# Patient Record
Sex: Female | Born: 1975 | Race: White | Hispanic: No | Marital: Married | Smoking: Never smoker
Health system: Southern US, Community
[De-identification: ages and names within clinical notes are randomized; demographics above are authoritative.]

## PROBLEM LIST (undated history)

## (undated) DIAGNOSIS — K219 Gastro-esophageal reflux disease without esophagitis: Secondary | ICD-10-CM

## (undated) DIAGNOSIS — F419 Anxiety disorder, unspecified: Secondary | ICD-10-CM

## (undated) DIAGNOSIS — M1990 Unspecified osteoarthritis, unspecified site: Secondary | ICD-10-CM

## (undated) DIAGNOSIS — F329 Major depressive disorder, single episode, unspecified: Secondary | ICD-10-CM

## (undated) DIAGNOSIS — F32A Depression, unspecified: Secondary | ICD-10-CM

## (undated) DIAGNOSIS — K52832 Lymphocytic colitis: Principal | ICD-10-CM

## (undated) DIAGNOSIS — Z803 Family history of malignant neoplasm of breast: Secondary | ICD-10-CM

## (undated) DIAGNOSIS — N63 Unspecified lump in unspecified breast: Secondary | ICD-10-CM

## (undated) DIAGNOSIS — R112 Nausea with vomiting, unspecified: Secondary | ICD-10-CM

## (undated) DIAGNOSIS — Z973 Presence of spectacles and contact lenses: Secondary | ICD-10-CM

## (undated) DIAGNOSIS — Z800 Family history of malignant neoplasm of digestive organs: Secondary | ICD-10-CM

## (undated) DIAGNOSIS — Z9889 Other specified postprocedural states: Secondary | ICD-10-CM

## (undated) DIAGNOSIS — R7303 Prediabetes: Secondary | ICD-10-CM

## (undated) DIAGNOSIS — E039 Hypothyroidism, unspecified: Secondary | ICD-10-CM

## (undated) HISTORY — PX: TONSILLECTOMY: SUR1361

## (undated) HISTORY — PX: COLONOSCOPY: SHX174

## (undated) HISTORY — DX: Family history of malignant neoplasm of breast: Z80.3

## (undated) HISTORY — DX: Family history of malignant neoplasm of digestive organs: Z80.0

## (undated) HISTORY — PX: BREAST BIOPSY: SHX20

## (undated) HISTORY — PX: OTHER SURGICAL HISTORY: SHX169

## (undated) HISTORY — DX: Lymphocytic colitis: K52.832

---

## 1998-09-29 ENCOUNTER — Other Ambulatory Visit: Admission: RE | Admit: 1998-09-29 | Discharge: 1998-09-29 | Payer: Self-pay | Admitting: *Deleted

## 1999-02-10 ENCOUNTER — Other Ambulatory Visit: Admission: RE | Admit: 1999-02-10 | Discharge: 1999-02-10 | Payer: Self-pay | Admitting: *Deleted

## 1999-03-11 ENCOUNTER — Other Ambulatory Visit: Admission: RE | Admit: 1999-03-11 | Discharge: 1999-03-11 | Payer: Self-pay | Admitting: *Deleted

## 1999-09-22 ENCOUNTER — Other Ambulatory Visit: Admission: RE | Admit: 1999-09-22 | Discharge: 1999-09-22 | Payer: Self-pay | Admitting: *Deleted

## 1999-11-24 ENCOUNTER — Other Ambulatory Visit: Admission: RE | Admit: 1999-11-24 | Discharge: 1999-11-24 | Payer: Self-pay | Admitting: *Deleted

## 1999-11-24 ENCOUNTER — Encounter (INDEPENDENT_AMBULATORY_CARE_PROVIDER_SITE_OTHER): Payer: Self-pay | Admitting: Specialist

## 2000-06-09 ENCOUNTER — Other Ambulatory Visit: Admission: RE | Admit: 2000-06-09 | Discharge: 2000-06-09 | Payer: Self-pay | Admitting: *Deleted

## 2000-06-20 ENCOUNTER — Emergency Department (HOSPITAL_COMMUNITY): Admission: EM | Admit: 2000-06-20 | Discharge: 2000-06-20 | Payer: Self-pay | Admitting: Emergency Medicine

## 2000-11-16 ENCOUNTER — Other Ambulatory Visit: Admission: RE | Admit: 2000-11-16 | Discharge: 2000-11-16 | Payer: Self-pay | Admitting: *Deleted

## 2000-11-18 ENCOUNTER — Encounter: Admission: RE | Admit: 2000-11-18 | Discharge: 2000-11-18 | Payer: Self-pay | Admitting: *Deleted

## 2000-11-18 ENCOUNTER — Encounter: Payer: Self-pay | Admitting: *Deleted

## 2001-06-15 ENCOUNTER — Other Ambulatory Visit: Admission: RE | Admit: 2001-06-15 | Discharge: 2001-06-15 | Payer: Self-pay | Admitting: *Deleted

## 2001-07-07 ENCOUNTER — Emergency Department (HOSPITAL_COMMUNITY): Admission: EM | Admit: 2001-07-07 | Discharge: 2001-07-08 | Payer: Self-pay | Admitting: Emergency Medicine

## 2001-12-13 ENCOUNTER — Other Ambulatory Visit: Admission: RE | Admit: 2001-12-13 | Discharge: 2001-12-13 | Payer: Self-pay | Admitting: *Deleted

## 2002-12-20 ENCOUNTER — Other Ambulatory Visit: Admission: RE | Admit: 2002-12-20 | Discharge: 2002-12-20 | Payer: Self-pay | Admitting: *Deleted

## 2004-01-30 ENCOUNTER — Other Ambulatory Visit: Admission: RE | Admit: 2004-01-30 | Discharge: 2004-01-30 | Payer: Self-pay | Admitting: *Deleted

## 2004-02-03 ENCOUNTER — Encounter: Admission: RE | Admit: 2004-02-03 | Discharge: 2004-02-03 | Payer: Self-pay | Admitting: *Deleted

## 2005-04-26 ENCOUNTER — Other Ambulatory Visit: Admission: RE | Admit: 2005-04-26 | Discharge: 2005-04-26 | Payer: Self-pay | Admitting: Obstetrics and Gynecology

## 2007-02-24 ENCOUNTER — Encounter: Admission: RE | Admit: 2007-02-24 | Discharge: 2007-02-24 | Payer: Self-pay | Admitting: Obstetrics and Gynecology

## 2008-11-22 ENCOUNTER — Encounter: Admission: RE | Admit: 2008-11-22 | Discharge: 2008-11-22 | Payer: Self-pay | Admitting: Obstetrics and Gynecology

## 2009-11-28 ENCOUNTER — Emergency Department (HOSPITAL_BASED_OUTPATIENT_CLINIC_OR_DEPARTMENT_OTHER): Admission: EM | Admit: 2009-11-28 | Discharge: 2009-11-29 | Payer: Self-pay | Admitting: Emergency Medicine

## 2009-11-29 ENCOUNTER — Ambulatory Visit: Payer: Self-pay | Admitting: Diagnostic Radiology

## 2010-11-01 ENCOUNTER — Encounter: Payer: Self-pay | Admitting: Obstetrics and Gynecology

## 2011-02-04 ENCOUNTER — Other Ambulatory Visit: Payer: Self-pay | Admitting: Obstetrics and Gynecology

## 2011-02-04 DIAGNOSIS — N6324 Unspecified lump in the left breast, lower inner quadrant: Secondary | ICD-10-CM

## 2011-02-09 ENCOUNTER — Ambulatory Visit
Admission: RE | Admit: 2011-02-09 | Discharge: 2011-02-09 | Disposition: A | Discharge status: Home / Self Care | Payer: Managed Care, Other (non HMO) | Source: Ambulatory Visit | Attending: Obstetrics and Gynecology | Admitting: Obstetrics and Gynecology

## 2011-02-09 ENCOUNTER — Other Ambulatory Visit: Payer: Self-pay | Admitting: Obstetrics and Gynecology

## 2011-02-09 DIAGNOSIS — N6324 Unspecified lump in the left breast, lower inner quadrant: Secondary | ICD-10-CM

## 2011-02-19 ENCOUNTER — Ambulatory Visit
Admission: RE | Admit: 2011-02-19 | Discharge: 2011-02-19 | Disposition: A | Discharge status: Home / Self Care | Payer: Managed Care, Other (non HMO) | Source: Ambulatory Visit | Attending: Obstetrics and Gynecology | Admitting: Obstetrics and Gynecology

## 2011-02-19 ENCOUNTER — Other Ambulatory Visit: Payer: Self-pay | Admitting: Diagnostic Radiology

## 2011-02-19 ENCOUNTER — Other Ambulatory Visit: Payer: Self-pay | Admitting: Obstetrics and Gynecology

## 2011-02-19 DIAGNOSIS — N6324 Unspecified lump in the left breast, lower inner quadrant: Secondary | ICD-10-CM

## 2011-02-19 HISTORY — PX: BREAST BIOPSY: SHX20

## 2011-06-01 ENCOUNTER — Encounter: Payer: Self-pay | Admitting: Student

## 2011-06-01 ENCOUNTER — Emergency Department (HOSPITAL_BASED_OUTPATIENT_CLINIC_OR_DEPARTMENT_OTHER)
Admission: EM | Admit: 2011-06-01 | Discharge: 2011-06-01 | Disposition: A | Discharge status: Home / Self Care | Payer: Managed Care, Other (non HMO) | Attending: Emergency Medicine | Admitting: Emergency Medicine

## 2011-06-01 DIAGNOSIS — R51 Headache: Secondary | ICD-10-CM | POA: Insufficient documentation

## 2011-06-01 HISTORY — DX: Depression, unspecified: F32.A

## 2011-06-01 HISTORY — DX: Major depressive disorder, single episode, unspecified: F32.9

## 2011-06-01 MED ORDER — KETOROLAC TROMETHAMINE 60 MG/2ML IM SOLN
60.0000 mg | Freq: Once | INTRAMUSCULAR | Status: AC
  Administered 2011-06-01: 60 mg via INTRAMUSCULAR
  Filled 2011-06-01: qty 2

## 2011-06-01 MED ORDER — PROMETHAZINE HCL 25 MG/ML IJ SOLN
25.0000 mg | Freq: Once | INTRAMUSCULAR | Status: AC
  Administered 2011-06-01: 25 mg via INTRAMUSCULAR
  Filled 2011-06-01: qty 1

## 2011-06-01 MED ORDER — PROMETHAZINE HCL 25 MG PO TABS: 25.0000 mg | ORAL_TABLET | Freq: Four times a day (QID) | ORAL | Status: AC | PRN

## 2011-06-01 NOTE — ED Provider Notes (Signed)
History     CSN: [id number] Arrival date & time: 06/01/2011  8:34 PM  Chief Complaint  Patient presents with  . Migraine   Patient is a 35 y.o. female presenting with headaches. The history is provided by the patient.  Headache  This is a new problem. The current episode started 3 to 5 hours ago. The problem occurs constantly. The problem has not changed since onset.The pain is located in the left unilateral region. The quality of the pain is described as sharp. The pain is at a severity of 10/10. The pain is severe. The pain does not radiate. Pertinent negatives include no anorexia, no fever, no malaise/fatigue, no near-syncope, no orthopnea, no shortness of breath and no nausea. She has tried nothing for the symptoms. The treatment provided no relief.  Pt has had previous migranes.  Pt reports she responds well to Torodol and phenergan.  Past Medical History  Diagnosis Date  . Migraine   . Depressed     Past Surgical History  Procedure Date  . Tonsillectomy     History reviewed. No pertinent family history.  History  Substance Use Topics  . Smoking status: Never Smoker   . Smokeless tobacco: Never Used  . Alcohol Use: No    OB History    Grav Para Term Preterm Abortions TAB SAB Ect Mult Living                  Review of Systems  Constitutional: Negative for fever and malaise/fatigue.  Respiratory: Negative for shortness of breath.   Cardiovascular: Negative for orthopnea and near-syncope.  Gastrointestinal: Negative for nausea and anorexia.  Neurological: Positive for headaches. Negative for dizziness, facial asymmetry, weakness, light-headedness and numbness.  All other systems reviewed and are negative.    Physical Exam  BP 124/86  Pulse 98  Temp(Src) 99.3 F (37.4 C) (Oral)  Resp 20  SpO2 100%  LMP 06/01/2011  Physical Exam  Nursing note and vitals reviewed. Constitutional: She is oriented to person, place, and time. She appears well-developed and  well-nourished.  HENT:  Head: Normocephalic and atraumatic.  Right Ear: External ear normal.  Left Ear: External ear normal.  Nose: Nose normal.  Mouth/Throat: Oropharynx is clear and moist.  Eyes: Conjunctivae and EOM are normal. Pupils are equal, round, and reactive to light.  Neck: Normal range of motion. Neck supple.  Cardiovascular: Normal rate and regular rhythm.   Pulmonary/Chest: Effort normal and breath sounds normal.  Abdominal: Soft.  Musculoskeletal: Normal range of motion.  Neurological: She is alert and oriented to person, place, and time. She has normal reflexes.  Skin: Skin is warm.  Psychiatric: She has a normal mood and affect.    ED Course  Procedures  MDM Pt given Torodol and Phenergan  Medical screening examination/treatment/procedure(s) were performed by non-physician practitioner and as supervising physician I was immediately available for consultation/collaboration. Osvaldo Human, M.D.    Langston Masker, [place] 06/01/11 2225  Carleene Cooper III, MD 06/02/11 2232

## 2011-06-01 NOTE — ED Notes (Signed)
Langston Masker PA-C at bedside to assess pt

## 2011-06-01 NOTE — ED Notes (Signed)
Pt in with c/o left side temple migraine HA x 2-3 days. Reports prior hx of MHA.

## 2011-07-05 ENCOUNTER — Encounter: Payer: Self-pay | Admitting: Internal Medicine

## 2011-08-12 ENCOUNTER — Other Ambulatory Visit: Payer: Managed Care, Other (non HMO) | Admitting: Internal Medicine

## 2011-08-16 ENCOUNTER — Other Ambulatory Visit: Payer: Self-pay | Admitting: Obstetrics and Gynecology

## 2011-08-16 DIAGNOSIS — N6009 Solitary cyst of unspecified breast: Secondary | ICD-10-CM

## 2011-08-18 ENCOUNTER — Other Ambulatory Visit: Payer: Managed Care, Other (non HMO) | Admitting: Internal Medicine

## 2011-08-20 ENCOUNTER — Other Ambulatory Visit: Payer: Managed Care, Other (non HMO)

## 2011-08-31 ENCOUNTER — Other Ambulatory Visit: Payer: Managed Care, Other (non HMO)

## 2011-11-17 ENCOUNTER — Other Ambulatory Visit: Payer: Self-pay | Admitting: Obstetrics and Gynecology

## 2011-11-17 ENCOUNTER — Ambulatory Visit
Admission: RE | Admit: 2011-11-17 | Discharge: 2011-11-17 | Disposition: A | Discharge status: Home / Self Care | Payer: Managed Care, Other (non HMO) | Source: Ambulatory Visit | Attending: Obstetrics and Gynecology | Admitting: Obstetrics and Gynecology

## 2011-11-17 DIAGNOSIS — N6009 Solitary cyst of unspecified breast: Secondary | ICD-10-CM

## 2012-11-06 ENCOUNTER — Other Ambulatory Visit: Payer: Self-pay | Admitting: *Deleted

## 2013-10-11 DIAGNOSIS — N809 Endometriosis, unspecified: Secondary | ICD-10-CM

## 2013-10-11 HISTORY — DX: Endometriosis, unspecified: N80.9

## 2013-10-16 ENCOUNTER — Ambulatory Visit (INDEPENDENT_AMBULATORY_CARE_PROVIDER_SITE_OTHER): Payer: BC Managed Care – PPO | Admitting: Internal Medicine

## 2013-10-16 ENCOUNTER — Encounter: Payer: Self-pay | Admitting: Internal Medicine

## 2013-10-16 VITALS — BP 110/78 | HR 78 | Ht 65.0 in | Wt 134.2 lb

## 2013-10-16 DIAGNOSIS — R197 Diarrhea, unspecified: Secondary | ICD-10-CM | POA: Insufficient documentation

## 2013-10-16 MED ORDER — NA SULFATE-K SULFATE-MG SULF 17.5-3.13-1.6 GM/177ML PO SOLN: ORAL | Status: DC

## 2013-10-16 NOTE — Patient Instructions (Signed)
You have been scheduled for a colonoscopy with propofol. Please follow written instructions given to you at your visit today.  Please use the prep kit you have been given today. If you use inhalers (even only as needed), please bring them with you on the day of your procedure.  I appreciate the opportunity to care for you.

## 2013-10-16 NOTE — Progress Notes (Signed)
Subjective:  Referred by: Gwenlyn Perking M.D.   Patient ID: Rachel Ryan, female    DOB: 11-Oct-1976, 38 y.o.   MRN: [id number]  HPI Patient is a very nice 38 year old married white woman who has had a 7 week history of diarrhea. Loose watery urgent stools during the day. No bleeding. There is bloating and occasional nausea but no abdominal pain. She's developed some anal discomfort. She's lost 2 or 3 pounds. Laboratory workup at primary care has shown normal TSH CBC CVAT overall except for slightly high sodium. Stool studies were ordered but she decided not to do those. She has not had recent antibiotics, there's been no foreign travel or sick contacts. She does not seem to have problems overnight. 14 years ago she had a colonoscopy because her mother apparently had a malignant polyp removed with her mother was young. She's had diarrhea off and on at times in her life but does not carry a diagnosis of irritable bowel syndrome or anything like that that I am aware of. She has never been tested for celiac disease. She has tried Imodium A-D and that has helped her but also has caused some bloating.  Allergies  Allergen Reactions  . Levofloxacin     Severe headache  . Sulfa Antibiotics     dizziness   Outpatient Prescriptions Prior to Visit  Medication Sig Dispense Refill  . ALPRAZolam (XANAX) 0.5 MG tablet Take 0.5 mg by mouth daily as needed. anxiety       . Aspirin-Acetaminophen-Caffeine (EXCEDRIN PO) Take 2 tablets by mouth every 8 (eight) hours as needed. headache       . diphenhydrAMINE (BENADRYL) 25 mg capsule Take 25 mg by mouth at bedtime as needed. sleep       . Multiple Vitamin (MULTIVITAMIN) tablet Take 1 tablet by mouth daily.        . sertraline (ZOLOFT) 50 MG tablet Take 50 mg by mouth daily.         No facility-administered medications prior to visit.   Past Medical History  Diagnosis Date  . Migraine   . Depressed    Past Surgical History  Procedure Laterality  Date  . Tonsillectomy    . Overbite    . Surgery to correct an overbite    . Colonoscopy     History   Social History  . Marital Status: Married    Spouse Name: N/A    Number of Children: N/A  . Years of Education: N/A   Occupational History  . unemployed    Social History Main Topics  . Smoking status: Never Smoker   . Smokeless tobacco: Never Used  . Alcohol Use: No  . Drug Use: No  . Sexual Activity:    Other Topics Concern  . None   Social History Narrative  . None   Family History  Problem Relation Age of Onset  . Colon polyps Mother     cancerous  . Breast cancer      Review of Systems All other review of systems negative denies any changes in stress in her life or anything like that.    Objective:   Physical Exam General:  Well-developed, well-nourished and in no acute distress Eyes:  anicteric. ENT:   Mouth and posterior pharynx free of lesions.  Neck:   supple w/o thyromegaly or mass.  Lungs: Clear to auscultation bilaterally. Heart:  S1S2, no rubs, murmurs, gallops. Abdomen:  soft, non-tender, no hepatosplenomegaly, hernia,  or mass and BS+.  Rectal: deferred Lymph:  no cervical or supraclavicular adenopathy. Extremities:   no edema Skin   no rash. Neuro:  A&O x 3.  Psych:  appropriate mood and  Affect.   Data Reviewed: Primary Notes from December, labs through care everywhere      Assessment & Plan:   1. Diarrhea    I appreciate the opportunity to care for this patient. CC: Gara Kroner, MD and Gwenlyn Perking, MD ([facility])

## 2013-10-16 NOTE — Assessment & Plan Note (Signed)
Cause unclear essentially she has a chronic diarrheal illness. It could be infection, could be the onset of inflammatory bowel disease. It is reasonable to pursue a colonoscopy. Risks benefits and indications were explained she understands and agrees to proceed. I'm not sure how to factor in the apparent history of malignant polyp in her normal there and her mother was 35 and is not having any problems with cancer since then. Await colonoscopy and then decide.  Depending upon the colonoscopy results she may need to be screened for celiac disease with antibodies.

## 2013-10-17 ENCOUNTER — Ambulatory Visit (AMBULATORY_SURGERY_CENTER): Payer: BC Managed Care – PPO | Admitting: Internal Medicine

## 2013-10-17 ENCOUNTER — Encounter: Payer: Self-pay | Admitting: Internal Medicine

## 2013-10-17 VITALS — BP 135/67 | HR 74 | Temp 99.5°F | Resp 20 | Ht 65.0 in | Wt 134.0 lb

## 2013-10-17 DIAGNOSIS — K5289 Other specified noninfective gastroenteritis and colitis: Secondary | ICD-10-CM

## 2013-10-17 DIAGNOSIS — R197 Diarrhea, unspecified: Secondary | ICD-10-CM

## 2013-10-17 HISTORY — PX: COLONOSCOPY: SHX174

## 2013-10-17 MED ORDER — DICYCLOMINE HCL 20 MG PO TABS: 20.0000 mg | ORAL_TABLET | Freq: Three times a day (TID) | ORAL | Status: DC

## 2013-10-17 MED ORDER — SODIUM CHLORIDE 0.9 % IV SOLN: 500.0000 mL | INTRAVENOUS | Status: DC

## 2013-10-17 NOTE — Op Note (Addendum)
[facility]  [address] [place], [id number]   COLONOSCOPY PROCEDURE REPORT  PATIENT: Rachel Ryan, Rachel Ryan  MR#: [id number] BIRTHDATE: [birth year] , 37  yrs. old GENDER: Female ENDOSCOPIST: Gatha Mayer, MD, [facility] PROCEDURE DATE:  10/17/2013 PROCEDURE:   Colonoscopy with biopsy First Screening Colonoscopy - Avg.  risk and is 50 yrs.  old or older - No.  Prior Negative Screening - Now for repeat screening. N/A  History of Adenoma - Now for follow-up colonoscopy & has been > or = to 3 yrs.  N/A  Polyps Removed Today? No.  Recommend repeat exam, <10 yrs? No. ASA CLASS:   Class II INDICATIONS:unexplained diarrhea. MEDICATIONS: propofol (Diprivan) 300mg  IV, MAC sedation, administered by CRNA, and These medications were titrated to patient response per physician's verbal order  DESCRIPTION OF PROCEDURE:   After the risks benefits and alternatives of the procedure were thoroughly explained, informed consent was obtained.  A digital rectal exam revealed no abnormalities of the rectum.   The LB [id number]  endoscope was introduced through the anus and advanced to the terminal ileum which was intubated for a short distance. No adverse events experienced.   The quality of the prep was excellent using Suprep The instrument was then slowly withdrawn as the colon was fully examined.  COLON FINDINGS: The mucosa appeared normal in the terminal ileum. A normal appearing cecum, ileocecal valve, and appendiceal orifice were identified.  The ascending, hepatic flexure, transverse, splenic flexure, descending, sigmoid colon and rectum appeared unremarkable.  No polyps or cancers were seen.  Multiple biopsies were performed.  Retroflexed views revealed no abnormalities. The time to cecum=2 minutes 02 seconds.  Withdrawal time=7 minutes 08 seconds.  The scope was withdrawn and the procedure completed. COMPLICATIONS: There were no complications.  ENDOSCOPIC IMPRESSION: 1.    Normal mucosa in the terminal ileum 2.   Normal colon; multiple biopsies were performed to look for microscopic colitis  RECOMMENDATIONS: 1.  Office will call with the results. 2.   If colon biopsies negative will do celiac testing. Trial of dicyclomine for now - do not take metronidazole yet.   eSigned:  Gatha Mayer, MD, [facility] 10/17/2013 11:59 AM Revised: 10/17/2013 11:59 AM  cc: Antony Contras, MD, Gwenlyn Perking, MD, and The Patient

## 2013-10-17 NOTE — Progress Notes (Signed)
Called to room to assist during endoscopic procedure.  Patient ID and intended procedure confirmed with present staff. Received instructions for my participation in the procedure from the performing physician.  

## 2013-10-17 NOTE — Progress Notes (Signed)
A/ox3 pleased with MAC, report to Karol RN 

## 2013-10-17 NOTE — Patient Instructions (Addendum)
The colonoscopy is normal but I took biopsies to look for microscopic colitis.  I will let you know the results in a few days but by early next week at the latest.  Please try the dicyclomine - I sent the prescription to [facility].  I appreciate the opportunity to care for you. Gatha Mayer, MD, FACG  YOU HAD AN ENDOSCOPIC PROCEDURE TODAY AT [facility]: Refer to the procedure report that was given to you for any specific questions about what was found during the examination.  If the procedure report does not answer your questions, please call your gastroenterologist to clarify.  If you requested that your care partner not be given the details of your procedure findings, then the procedure report has been included in a sealed envelope for you to review at your convenience later.  YOU SHOULD EXPECT: Some feelings of bloating in the abdomen. Passage of more gas than usual.  Walking can help get rid of the air that was put into your GI tract during the procedure and reduce the bloating. If you had a lower endoscopy (such as a colonoscopy or flexible sigmoidoscopy) you may notice spotting of blood in your stool or on the toilet paper. If you underwent a bowel prep for your procedure, then you may not have a normal bowel movement for a few days.  DIET: Your first meal following the procedure should be a light meal and then it is ok to progress to your normal diet.  A half-sandwich or bowl of soup is an example of a good first meal.  Heavy or fried foods are harder to digest and may make you feel nauseous or bloated.  Likewise meals heavy in dairy and vegetables can cause extra gas to form and this can also increase the bloating.  Drink plenty of fluids but you should avoid alcoholic beverages for 24 hours.  ACTIVITY: Your care partner should take you home directly after the procedure.  You should plan to take it easy, moving slowly for the rest of the day.  You can resume normal  activity the day after the procedure however you should NOT DRIVE or use heavy machinery for 24 hours (because of the sedation medicines used during the test).    SYMPTOMS TO REPORT IMMEDIATELY: A gastroenterologist can be reached at any hour.  During normal business hours, 8:30 AM to 5:00 PM Monday through Friday, call [phone].  After hours and on weekends, please call the GI answering service at [phone] who will take a message and have the physician on call contact you.   Following lower endoscopy (colonoscopy or flexible sigmoidoscopy):  Excessive amounts of blood in the stool  Significant tenderness or worsening of abdominal pains  Swelling of the abdomen that is new, acute  Fever of 100F or higher  FOLLOW UP: If any biopsies were taken you will be contacted by phone or by letter within the next 1-3 weeks.  Call your gastroenterologist if you have not heard about the biopsies in 3 weeks.  Our staff will call the home number listed on your records the next business day following your procedure to check on you and address any questions or concerns that you may have at that time regarding the information given to you following your procedure. This is a courtesy call and so if there is no answer at the home number and we have not heard from you through the emergency physician on call, we will assume that  you have returned to your regular daily activities without incident.  SIGNATURES/CONFIDENTIALITY: You and/or your care partner have signed paperwork which will be entered into your electronic medical record.  These signatures attest to the fact that that the information above on your After Visit Summary has been reviewed and is understood.  Full responsibility of the confidentiality of this discharge information lies with you and/or your care-partner.

## 2013-10-18 ENCOUNTER — Telehealth: Payer: Self-pay | Admitting: *Deleted

## 2013-10-18 NOTE — Telephone Encounter (Signed)
  Follow up Call-  Call back number 10/17/2013  Post procedure Call Back phone  # [phone]  Permission to leave phone message Yes     [facility]

## 2013-10-22 ENCOUNTER — Encounter: Payer: Self-pay | Admitting: Internal Medicine

## 2013-10-22 ENCOUNTER — Other Ambulatory Visit: Payer: Self-pay | Admitting: Internal Medicine

## 2013-10-22 DIAGNOSIS — K52832 Lymphocytic colitis: Secondary | ICD-10-CM

## 2013-10-22 HISTORY — DX: Lymphocytic colitis: K52.832

## 2013-10-22 MED ORDER — BUDESONIDE 3 MG PO CP24: 9.0000 mg | ORAL_CAPSULE | Freq: Every day | ORAL | Status: DC

## 2013-10-22 NOTE — Progress Notes (Signed)
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

## 2013-10-31 ENCOUNTER — Telehealth: Payer: Self-pay | Admitting: Internal Medicine

## 2013-11-01 NOTE — Telephone Encounter (Signed)
Sounds like she may be cured  1) No further Tx 2) keep f/u  3) did she get thrush Txed?

## 2013-11-01 NOTE — Telephone Encounter (Signed)
Spoke with patient today.  She said the Entocort will be $260 with her insurance.  She took 6 days of Flagyl she had on hand from the dentist, then developed thrush so stopped taking it.The diarrhea stopped after taking the Flagyl. So she wants you to advise her about less expensive rx  Sir, thank you.

## 2013-11-01 NOTE — Telephone Encounter (Signed)
Informed patient no further Tx.  She is going to call back and set up f/u appointment.  Her thrush is now gone after using apple cider vinegar which she swished and gargled with.  She found this idea on the Internet.

## 2014-03-02 ENCOUNTER — Encounter (HOSPITAL_BASED_OUTPATIENT_CLINIC_OR_DEPARTMENT_OTHER): Payer: Self-pay | Admitting: Emergency Medicine

## 2014-03-02 ENCOUNTER — Emergency Department (HOSPITAL_BASED_OUTPATIENT_CLINIC_OR_DEPARTMENT_OTHER)
Admission: EM | Admit: 2014-03-02 | Discharge: 2014-03-02 | Disposition: A | Discharge status: Home / Self Care | Payer: BC Managed Care – PPO | Attending: Emergency Medicine | Admitting: Emergency Medicine

## 2014-03-02 ENCOUNTER — Emergency Department (HOSPITAL_BASED_OUTPATIENT_CLINIC_OR_DEPARTMENT_OTHER): Payer: BC Managed Care – PPO

## 2014-03-02 DIAGNOSIS — R197 Diarrhea, unspecified: Secondary | ICD-10-CM | POA: Insufficient documentation

## 2014-03-02 DIAGNOSIS — F329 Major depressive disorder, single episode, unspecified: Secondary | ICD-10-CM | POA: Insufficient documentation

## 2014-03-02 DIAGNOSIS — R1110 Vomiting, unspecified: Secondary | ICD-10-CM | POA: Insufficient documentation

## 2014-03-02 DIAGNOSIS — F3289 Other specified depressive episodes: Secondary | ICD-10-CM | POA: Insufficient documentation

## 2014-03-02 DIAGNOSIS — N6009 Solitary cyst of unspecified breast: Secondary | ICD-10-CM | POA: Insufficient documentation

## 2014-03-02 DIAGNOSIS — Z8679 Personal history of other diseases of the circulatory system: Secondary | ICD-10-CM | POA: Insufficient documentation

## 2014-03-02 DIAGNOSIS — N83209 Unspecified ovarian cyst, unspecified side: Secondary | ICD-10-CM | POA: Insufficient documentation

## 2014-03-02 DIAGNOSIS — Z79899 Other long term (current) drug therapy: Secondary | ICD-10-CM | POA: Insufficient documentation

## 2014-03-02 DIAGNOSIS — Z8719 Personal history of other diseases of the digestive system: Secondary | ICD-10-CM | POA: Insufficient documentation

## 2014-03-02 DIAGNOSIS — Z3202 Encounter for pregnancy test, result negative: Secondary | ICD-10-CM | POA: Insufficient documentation

## 2014-03-02 LAB — CBC WITH DIFFERENTIAL/PLATELET
BASOS ABS: 0 10*3/uL (ref 0.0–0.1)
BASOS PCT: 0 % (ref 0–1)
EOS PCT: 0 % (ref 0–5)
Eosinophils Absolute: 0 10*3/uL (ref 0.0–0.7)
HCT: 35.5 % — ABNORMAL LOW (ref 36.0–46.0)
Hemoglobin: 12.1 g/dL (ref 12.0–15.0)
Lymphocytes Relative: 9 % — ABNORMAL LOW (ref 12–46)
Lymphs Abs: 1 10*3/uL (ref 0.7–4.0)
MCH: 33.8 pg (ref 26.0–34.0)
MCHC: 34.1 g/dL (ref 30.0–36.0)
MCV: 99.2 fL (ref 78.0–100.0)
Monocytes Absolute: 0.6 10*3/uL (ref 0.1–1.0)
Monocytes Relative: 6 % (ref 3–12)
Neutro Abs: 9 10*3/uL — ABNORMAL HIGH (ref 1.7–7.7)
Neutrophils Relative %: 85 % — ABNORMAL HIGH (ref 43–77)
PLATELETS: 257 10*3/uL (ref 150–400)
RBC: 3.58 MIL/uL — ABNORMAL LOW (ref 3.87–5.11)
RDW: 12.2 % (ref 11.5–15.5)
WBC: 10.7 10*3/uL — ABNORMAL HIGH (ref 4.0–10.5)

## 2014-03-02 LAB — URINALYSIS, ROUTINE W REFLEX MICROSCOPIC
Glucose, UA: NEGATIVE mg/dL
Ketones, ur: 15 mg/dL — AB
LEUKOCYTES UA: NEGATIVE
Nitrite: NEGATIVE
PROTEIN: 30 mg/dL — AB
Specific Gravity, Urine: 1.025 (ref 1.005–1.030)
UROBILINOGEN UA: 0.2 mg/dL (ref 0.0–1.0)
pH: 6.5 (ref 5.0–8.0)

## 2014-03-02 LAB — BASIC METABOLIC PANEL
BUN: 14 mg/dL (ref 6–23)
CALCIUM: 9.4 mg/dL (ref 8.4–10.5)
CO2: 22 meq/L (ref 19–32)
Chloride: 103 mEq/L (ref 96–112)
Creatinine, Ser: 0.7 mg/dL (ref 0.50–1.10)
GFR calc non Af Amer: >90 mL/min (ref >90)
Glucose, Bld: 110 mg/dL — ABNORMAL HIGH (ref 70–99)
Potassium: 3.8 mEq/L (ref 3.7–5.3)
Sodium: 141 mEq/L (ref 137–147)

## 2014-03-02 LAB — URINE MICROSCOPIC-ADD ON

## 2014-03-02 LAB — LIPASE, BLOOD: LIPASE: 33 U/L (ref 11–59)

## 2014-03-02 LAB — PREGNANCY, URINE: Preg Test, Ur: NEGATIVE

## 2014-03-02 MED ORDER — IOHEXOL 300 MG/ML  SOLN
50.0000 mL | Freq: Once | INTRAMUSCULAR | Status: AC | PRN
  Administered 2014-03-02: 50 mL via ORAL

## 2014-03-02 MED ORDER — ONDANSETRON 8 MG PO TBDP: ORAL_TABLET | ORAL | Status: DC

## 2014-03-02 MED ORDER — SODIUM CHLORIDE 0.9 % IV SOLN
Freq: Once | INTRAVENOUS | Status: AC
  Administered 2014-03-02: 01:00:00 via INTRAVENOUS

## 2014-03-02 MED ORDER — DICYCLOMINE HCL 20 MG PO TABS
20.0000 mg | ORAL_TABLET | Freq: Two times a day (BID) | ORAL | Status: DC
Start: 2014-03-02 — End: 2019-07-04

## 2014-03-02 MED ORDER — IOHEXOL 300 MG/ML  SOLN
100.0000 mL | Freq: Once | INTRAMUSCULAR | Status: AC | PRN
  Administered 2014-03-02: 100 mL via INTRAVENOUS

## 2014-03-02 MED ORDER — MORPHINE SULFATE 4 MG/ML IJ SOLN
4.0000 mg | Freq: Once | INTRAMUSCULAR | Status: AC
  Administered 2014-03-02: 4 mg via INTRAVENOUS
  Filled 2014-03-02: qty 1

## 2014-03-02 MED ORDER — DICYCLOMINE HCL 10 MG/ML IM SOLN
20.0000 mg | Freq: Once | INTRAMUSCULAR | Status: AC
  Administered 2014-03-02: 20 mg via INTRAMUSCULAR
  Filled 2014-03-02: qty 2

## 2014-03-02 NOTE — ED Provider Notes (Signed)
CSN: [id number]     Arrival date & time 03/02/14  8546 History   First MD Initiated Contact with Patient 03/02/14 0102     Chief Complaint  Patient presents with  . Abdominal Pain     (Consider location/radiation/quality/duration/timing/severity/associated sxs/prior Treatment) Patient is a 38 y.o. female presenting with abdominal pain. The history is provided by the patient.  Abdominal Pain Pain location:  Suprapubic Pain radiates to:  Does not radiate Pain severity:  Severe Onset quality:  Gradual Timing:  Constant Progression:  Unchanged Context: not alcohol use   Relieved by:  Nothing Worsened by:  Nothing tried Associated symptoms: diarrhea and vomiting   Associated symptoms: no dysuria   Risk factors: no recent hospitalization     Past Medical History  Diagnosis Date  . Migraine   . Depressed   . 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 Tonsillectomy    . Overbite    . Surgery to correct an overbite    . Colonoscopy     Family History  Problem Relation Age of Onset  . Colon polyps Mother     cancerous  . Breast cancer     History  Substance Use Topics  . Smoking status: Never Smoker   . Smokeless tobacco: Never Used  . Alcohol Use: No   OB History   Grav Para Term Preterm Abortions TAB SAB Ect Mult Living                 Review of Systems  Gastrointestinal: Positive for vomiting, abdominal pain and diarrhea.  Genitourinary: Negative for dysuria.  All other systems reviewed and are negative.     Allergies  Levofloxacin and Sulfa antibiotics  Home Medications   Prior to Admission medications   Medication Sig Start Date End Date Taking? Authorizing Provider  ALPRAZolam Duanne Moron) 0.5 MG tablet Take 0.5 mg by mouth daily as needed. anxiety     Historical Provider, MD  Aspirin-Acetaminophen-Caffeine (EXCEDRIN PO) Take 2 tablets by mouth  every 8 (eight) hours as needed. headache     Historical Provider, MD  diphenhydrAMINE (BENADRYL) 25 mg capsule Take 25 mg by mouth at bedtime as needed. sleep     Historical Provider, MD  Multiple Vitamin (MULTIVITAMIN) tablet Take 1 tablet by mouth daily.      Historical Provider, MD  sertraline (ZOLOFT) 50 MG tablet Take 50 mg by mouth daily.      Historical Provider, MD   BP 120/59  Pulse 95  Temp(Src) 98.9 F (37.2 C)  Resp 20  Wt 134 lb (60.782 kg)  SpO2 100% Physical Exam  Constitutional: She is oriented to person, place, and time. She appears well-developed and well-nourished. No distress.  HENT:  Head: Normocephalic and atraumatic.  Mouth/Throat: Oropharynx is clear and moist.  Eyes: Conjunctivae are normal. Pupils are equal, round, and reactive to light.  Neck: Normal range of motion. Neck supple.  Pulmonary/Chest: Effort normal and breath sounds normal. She has no wheezes. She has no rales.  Abdominal: Soft. Bowel sounds are normal. She exhibits no distension. There is no tenderness. There is no rebound and no guarding.    Musculoskeletal: Normal range of motion.  Neurological: She is alert and oriented to person, place, and time.  Skin: Skin is warm and dry.  Psychiatric: Thought content normal.    ED Course  Procedures (including critical care time) Labs Review Labs  Reviewed  PREGNANCY, URINE  URINALYSIS, ROUTINE W REFLEX MICROSCOPIC  CBC WITH DIFFERENTIAL  BASIC METABOLIC PANEL  LIPASE, BLOOD    Imaging Review No results found.   EKG Interpretation None      MDM   Final diagnoses:  None    Will prescribe bentyl for patient's symptoms.  As symptoms are on the left I doubt the small right ovarian cyst is the cause.  Will recommend mammogram to assess breast cysts seen on CT follow up with your family doctor for ongoing care    Shahmeer Bunn K Zakaiya Lares-Rasch, MD 03/02/14 [phone]

## 2014-03-02 NOTE — ED Notes (Signed)
Onset tonight of severe low abd pain with vomiting and diarrhea

## 2014-09-23 ENCOUNTER — Other Ambulatory Visit: Payer: Self-pay | Admitting: Obstetrics and Gynecology

## 2014-09-24 ENCOUNTER — Other Ambulatory Visit: Payer: Self-pay | Admitting: Obstetrics and Gynecology

## 2014-09-24 DIAGNOSIS — N6310 Unspecified lump in the right breast, unspecified quadrant: Secondary | ICD-10-CM

## 2014-09-24 DIAGNOSIS — N6320 Unspecified lump in the left breast, unspecified quadrant: Secondary | ICD-10-CM

## 2014-09-24 DIAGNOSIS — N6325 Unspecified lump in the left breast, overlapping quadrants: Secondary | ICD-10-CM

## 2014-09-24 DIAGNOSIS — N6323 Unspecified lump in the left breast, lower outer quadrant: Secondary | ICD-10-CM

## 2014-09-25 LAB — CYTOLOGY - PAP

## 2014-10-08 ENCOUNTER — Ambulatory Visit
Admission: RE | Admit: 2014-10-08 | Discharge: 2014-10-08 | Disposition: A | Discharge status: Home / Self Care | Payer: BC Managed Care – PPO | Source: Ambulatory Visit | Attending: Obstetrics and Gynecology | Admitting: Obstetrics and Gynecology

## 2014-10-08 ENCOUNTER — Other Ambulatory Visit: Payer: Self-pay | Admitting: Obstetrics and Gynecology

## 2014-10-08 DIAGNOSIS — N6310 Unspecified lump in the right breast, unspecified quadrant: Secondary | ICD-10-CM

## 2014-10-08 DIAGNOSIS — N6320 Unspecified lump in the left breast, unspecified quadrant: Secondary | ICD-10-CM

## 2014-10-08 DIAGNOSIS — N6323 Unspecified lump in the left breast, lower outer quadrant: Secondary | ICD-10-CM

## 2014-10-08 DIAGNOSIS — N6325 Unspecified lump in the left breast, overlapping quadrants: Secondary | ICD-10-CM

## 2014-10-10 ENCOUNTER — Other Ambulatory Visit: Payer: Self-pay | Admitting: Obstetrics and Gynecology

## 2014-10-10 DIAGNOSIS — N6325 Unspecified lump in the left breast, overlapping quadrants: Secondary | ICD-10-CM

## 2014-10-10 DIAGNOSIS — N6323 Unspecified lump in the left breast, lower outer quadrant: Secondary | ICD-10-CM

## 2014-10-10 DIAGNOSIS — N6320 Unspecified lump in the left breast, unspecified quadrant: Secondary | ICD-10-CM

## 2014-10-10 DIAGNOSIS — N6310 Unspecified lump in the right breast, unspecified quadrant: Secondary | ICD-10-CM

## 2014-10-11 HISTORY — PX: ABDOMINAL EXPLORATION SURGERY: SHX538

## 2014-10-14 ENCOUNTER — Other Ambulatory Visit: Payer: Self-pay

## 2014-10-14 ENCOUNTER — Other Ambulatory Visit: Payer: Self-pay | Admitting: Obstetrics and Gynecology

## 2014-10-14 DIAGNOSIS — N6323 Unspecified lump in the left breast, lower outer quadrant: Secondary | ICD-10-CM

## 2014-10-14 DIAGNOSIS — N6320 Unspecified lump in the left breast, unspecified quadrant: Secondary | ICD-10-CM

## 2014-10-14 DIAGNOSIS — N6310 Unspecified lump in the right breast, unspecified quadrant: Secondary | ICD-10-CM

## 2014-10-14 DIAGNOSIS — N6325 Unspecified lump in the left breast, overlapping quadrants: Secondary | ICD-10-CM

## 2014-10-18 ENCOUNTER — Ambulatory Visit
Admission: RE | Admit: 2014-10-18 | Discharge: 2014-10-18 | Disposition: A | Discharge status: Home / Self Care | Payer: BLUE CROSS/BLUE SHIELD | Source: Ambulatory Visit | Attending: Obstetrics and Gynecology | Admitting: Obstetrics and Gynecology

## 2014-10-18 ENCOUNTER — Other Ambulatory Visit: Payer: BC Managed Care – PPO

## 2014-10-18 DIAGNOSIS — N6325 Unspecified lump in the left breast, overlapping quadrants: Secondary | ICD-10-CM

## 2014-10-18 DIAGNOSIS — N6323 Unspecified lump in the left breast, lower outer quadrant: Secondary | ICD-10-CM

## 2014-10-18 DIAGNOSIS — N6310 Unspecified lump in the right breast, unspecified quadrant: Secondary | ICD-10-CM

## 2014-10-18 DIAGNOSIS — N6320 Unspecified lump in the left breast, unspecified quadrant: Secondary | ICD-10-CM

## 2014-10-28 HISTORY — PX: BREAST BIOPSY: SHX20

## 2015-02-11 ENCOUNTER — Other Ambulatory Visit: Payer: Self-pay | Admitting: Obstetrics and Gynecology

## 2015-02-11 ENCOUNTER — Other Ambulatory Visit: Payer: Self-pay

## 2015-02-11 DIAGNOSIS — N6310 Unspecified lump in the right breast, unspecified quadrant: Secondary | ICD-10-CM

## 2015-03-17 ENCOUNTER — Ambulatory Visit
Admission: RE | Admit: 2015-03-17 | Discharge: 2015-03-17 | Disposition: A | Discharge status: Home / Self Care | Payer: BLUE CROSS/BLUE SHIELD | Source: Ambulatory Visit | Attending: Obstetrics and Gynecology | Admitting: Obstetrics and Gynecology

## 2015-03-17 ENCOUNTER — Other Ambulatory Visit: Payer: Self-pay | Admitting: Obstetrics and Gynecology

## 2015-03-17 ENCOUNTER — Ambulatory Visit
Admission: RE | Admit: 2015-03-17 | Discharge: 2015-03-17 | Disposition: A | Discharge status: Home / Self Care | Payer: BLUE CROSS/BLUE SHIELD | Source: Ambulatory Visit

## 2015-03-17 DIAGNOSIS — N6320 Unspecified lump in the left breast, unspecified quadrant: Secondary | ICD-10-CM

## 2015-03-17 DIAGNOSIS — N6310 Unspecified lump in the right breast, unspecified quadrant: Secondary | ICD-10-CM

## 2015-12-23 ENCOUNTER — Other Ambulatory Visit: Payer: Self-pay | Admitting: Obstetrics and Gynecology

## 2015-12-23 DIAGNOSIS — N63 Unspecified lump in unspecified breast: Secondary | ICD-10-CM

## 2015-12-24 ENCOUNTER — Other Ambulatory Visit: Payer: Self-pay | Admitting: Obstetrics and Gynecology

## 2015-12-24 DIAGNOSIS — Z1239 Encounter for other screening for malignant neoplasm of breast: Secondary | ICD-10-CM

## 2015-12-26 ENCOUNTER — Ambulatory Visit
Admission: RE | Admit: 2015-12-26 | Discharge: 2015-12-26 | Disposition: A | Discharge status: Home / Self Care | Payer: BLUE CROSS/BLUE SHIELD | Source: Ambulatory Visit | Attending: Obstetrics and Gynecology | Admitting: Obstetrics and Gynecology

## 2015-12-26 ENCOUNTER — Other Ambulatory Visit: Payer: Self-pay | Admitting: Obstetrics and Gynecology

## 2015-12-26 DIAGNOSIS — N63 Unspecified lump in unspecified breast: Secondary | ICD-10-CM

## 2015-12-26 DIAGNOSIS — N6001 Solitary cyst of right breast: Secondary | ICD-10-CM

## 2015-12-26 HISTORY — PX: BREAST CYST ASPIRATION: SHX578

## 2016-01-08 ENCOUNTER — Encounter: Payer: Self-pay | Admitting: Genetic Counselor

## 2016-02-16 ENCOUNTER — Encounter: Payer: BLUE CROSS/BLUE SHIELD | Admitting: Genetic Counselor

## 2016-02-16 ENCOUNTER — Other Ambulatory Visit: Payer: BLUE CROSS/BLUE SHIELD

## 2016-03-22 ENCOUNTER — Other Ambulatory Visit: Payer: BLUE CROSS/BLUE SHIELD

## 2016-03-22 ENCOUNTER — Ambulatory Visit (HOSPITAL_BASED_OUTPATIENT_CLINIC_OR_DEPARTMENT_OTHER): Payer: BLUE CROSS/BLUE SHIELD | Admitting: Genetic Counselor

## 2016-03-22 ENCOUNTER — Encounter: Payer: Self-pay | Admitting: Genetic Counselor

## 2016-03-22 DIAGNOSIS — Z800 Family history of malignant neoplasm of digestive organs: Secondary | ICD-10-CM

## 2016-03-22 DIAGNOSIS — Z803 Family history of malignant neoplasm of breast: Secondary | ICD-10-CM | POA: Insufficient documentation

## 2016-03-22 NOTE — Progress Notes (Signed)
REFERRING PROVIDER: Antony Contras, MD Rachel Ryan, [place] [id number]   Juanda Chance, NP  PRIMARY PROVIDER:  Gara Kroner, MD  PRIMARY REASON FOR VISIT:  1. Family history of breast cancer   2. Family history of colon cancer      HISTORY OF PRESENT ILLNESS:   Rachel Ryan, a 40 y.o. female, was seen for a [place] cancer genetics consultation at the request of Juanda Chance due to a family history of cancer.  Rachel Ryan presents to clinic today to discuss the possibility of a hereditary predisposition to cancer, genetic testing, and to further clarify her future cancer risks, as well as potential cancer risks for family members.  Rachel Ryan is a 40 y.o. female with no personal history of cancer.  She has undergone mammography throughout her 67s and has very dense breasts.  She is very concerned about her risk for cancer in general, not just breast cancer.  CANCER HISTORY:   No history exists.     HORMONAL RISK FACTORS:  Menarche was at age 19.  First live birth at age 58.  OCP use for approximately 5-10 years.  Ovaries intact: yes.  Hysterectomy: no.  Menopausal status: premenopausal.  HRT use: 0 years. Colonoscopy: yes; normal. Mammogram within the last year: yes. Number of breast biopsies: 3. Up to date with pelvic exams:  yes. Any excessive radiation exposure in the past:  no  Past Medical History  Diagnosis Date  . Migraine   . Depressed   . 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 Family history of breast cancer   . Family history of colon cancer     Past Surgical History  Procedure Laterality Date  . Tonsillectomy    . Overbite    . Surgery to correct an overbite    . Colonoscopy      Social History   Social History  . Marital Status: Married    Spouse Name: N/A  . Number of Children: 1  . Years of Education: N/A   Occupational History  . unemployed    Social History  Main Topics  . Smoking status: Never Smoker   . Smokeless tobacco: Never Used  . Alcohol Use: No  . Drug Use: No  . Sexual Activity: Yes     Comment: lmp 09/26/13   Other Topics Concern  . None   Social History Narrative     FAMILY HISTORY:  We obtained a detailed, 4-generation family history.  Significant diagnoses are listed below: Family History  Problem Relation Age of Onset  . Colon polyps Mother     cancerous  . Colon cancer Mother 30  . Breast cancer Paternal Aunt 21  . Breast cancer Paternal Grandfather 61  . Breast cancer Paternal Aunt 12    Genetic testing - MUTYH mutation  . Melanoma Maternal Uncle     dx in his 1s  . Breast cancer Other     MGMs sister dx in her 56s-30s    The patient has one son who is healthy.  She had two brothers.  One brother died in a car accident at 40 and the other brother is healthy.  Her mother was diagnosed with colon cancer at 79, and her father is healthy.  Her mother had two brothers, one who had melanoma in his 37s.  The patient's father has three sisters and two brothers.  One sister had breast cancer at  14, and other sister had breast cancer at 44.  This sister reportedly had genetic testing and was found to have an MUTYH mutation.  The patient's paternal grandmother had breast cancer in her 22s.  Patient's maternal ancestors are of [place] and [place] descent, and paternal ancestors are of [place] and [place] descent. There is no reported Ashkenazi Jewish ancestry. There is no known consanguinity.  GENETIC COUNSELING ASSESSMENT: Rachel Ryan is a 40 y.o. female with a family history of breast and colon cancer as well as melanoma which is somewhat suggestive of a hereditary cancer syndrome and predisposition to cancer. We, therefore, discussed and recommended the following at today's visit.   DISCUSSION: We discussed that about 5-10% of breast cancer is hereditayr with most cases due to BRCA mutations.  About 5-6% of colon cancer is  hereditary with most cases due to Lynch syndrome.  We discussed other hereditary breast cancer syndromes including PALB2, ATM and CHEK2.  Reportedly her aunt was positive for an MUTYH mutation, but does not have colon cancer.  Therefore this is suggestive that her aunt is a heterozygote.  We reviewed MUTYH inheritance, and discussed that if her mother has MAP associated polyposis, there was a 25% chance that she could inherit the MUTYH mutation that her aunt has, and then she would also have MAP associated polyposis. The patient states that her mother has not had any polyps since the original cancer diagnosis.  This reduces the likelihood that her mother has MAP associated polyposis.  We reviewed the characteristics, features and inheritance patterns of hereditary cancer syndromes. We also discussed genetic testing, including the appropriate family members to test, the process of testing, insurance coverage and turn-around-time for results. We discussed the implications of a negative, positive and/or variant of uncertain significant result. We recommended Rachel Ryan pursue genetic testing for the comprehensive cancer panel and we will add MITF and BAP1 due to the family history of melanoma.  The Custom Cancer Panel offered by [facility] includes sequencing and/or deletion duplication testing of the following 34 genes: APC, ATM, AXIN2, BAP1, BARD1, BMPR1A, BRCA1, BRCA2, BRIP1, CDH1, CDK4, CDKN2A, CHEK2, EPCAM, FANCC, MITF, MLH1, MSH2, MSH6, MUTYH, NBN, PALB2, PMS2, POLD1, POLE, PTEN, RAD51C, RAD51D, SCG5/GREM1, SMAD4, STK11, TP53, VHL, and XRCC2.     Based on Rachel Ryan's family history of cancer, she meets medical criteria for genetic testing. Despite that she meets criteria, she may still have an out of pocket cost. We discussed that if her out of pocket cost for testing is over $100, the laboratory will call and confirm whether she wants to proceed with testing.  If the out of pocket cost of testing is less than $100  she will be billed by the genetic testing laboratory.   Based on the patient's personal and family history, statistical models (Tyrer Cusik)  and literature data were used to estimate her risk of developing breast cancer. These estimate her lifetime risk of developing breast cancer to be approximately 20.7%. This estimation takes into account her aunt's presumably negative genetic testing results.  The patient's lifetime breast cancer risk is a preliminary estimate based on available information using one of several models endorsed by the [place] (ACS). The ACS recommends consideration of breast MRI screening as an adjunct to mammography for patients at high risk (defined as 20% or greater lifetime risk). A more detailed breast cancer risk assessment can be considered, if clinically indicated.   Rachel Ryan has been determined to be at high risk for  breast cancer.  Therefore, we recommend that annual screening with mammography and breast MRI begin at age 61, or 10 years prior to the age of breast cancer diagnosis in a relative (whichever is earlier).  We discussed that Rachel Ryan should discuss her individual situation with her referring physician and determine a breast cancer screening plan with which they are both comfortable.    PLAN: After considering the risks, benefits, and limitations, Rachel Ryan  provided informed consent to pursue genetic testing and the blood sample was sent to [facility] for analysis of the Custom panel. Results should be available within approximately 2-3 weeks' time, at which point they will be disclosed by telephone to Rachel Ryan, as will any additional recommendations warranted by these results. Rachel Ryan will receive a summary of her genetic counseling visit and a copy of her results once available. This information will also be available in Epic. We encouraged Rachel Ryan to remain in contact with cancer genetics annually so that we can continuously  update the family history and inform her of any changes in cancer genetics and testing that may be of benefit for her family. Rachel Ryan questions were answered to her satisfaction today. Our contact information was provided should additional questions or concerns arise.  Lastly, we encouraged Rachel Ryan to remain in contact with cancer genetics annually so that we can continuously update the family history and inform her of any changes in cancer genetics and testing that may be of benefit for this family.   Ms.  Ryan questions were answered to her satisfaction today. Our contact information was provided should additional questions or concerns arise. Thank you for the referral and allowing [place] to share in the care of your patient.   Sanaz Scarlett P. Florene Glen, [place], [facility] Certified Genetic Counselor [email or web address] phone: [phone]  The patient was seen for a total of 45 minutes in face-to-face genetic counseling.  This patient was discussed with Drs. Magrinat, Lindi Adie and/or Burr Medico who agrees with the above.    _______________________________________________________________________ For Office Staff:  Number of people involved in session: 1 Was an Intern/ student involved with case: no

## 2016-04-21 ENCOUNTER — Telehealth: Payer: Self-pay | Admitting: Genetic Counselor

## 2016-04-21 NOTE — Telephone Encounter (Signed)
Revealed negative genetic testing on a custom panel looking at 34 different genes.  Her paternal aunt had an MUTYH mutation, and Rachel Ryan does not have that.  Based on [place] she is still at increased risk for breast cancer.  This will change over time.  Patient mentioned that Juanda Chance has ordered a Breast MRI for her.  We will send a note to Arbie Cookey about her negative genetic testing.

## 2016-04-23 ENCOUNTER — Ambulatory Visit: Payer: Self-pay | Admitting: Genetic Counselor

## 2016-04-23 DIAGNOSIS — Z803 Family history of malignant neoplasm of breast: Secondary | ICD-10-CM

## 2016-04-23 DIAGNOSIS — Z800 Family history of malignant neoplasm of digestive organs: Secondary | ICD-10-CM

## 2016-04-23 DIAGNOSIS — Z1379 Encounter for other screening for genetic and chromosomal anomalies: Secondary | ICD-10-CM | POA: Insufficient documentation

## 2016-04-23 NOTE — Progress Notes (Addendum)
HPI: Ms. Rachel Ryan was previously seen in the [facility] clinic due to a family history of cancer and concerns regarding a hereditary predisposition to cancer. Please refer to our prior cancer genetics clinic note for more information regarding Ms. Rachel Ryan's medical, social and family histories, and our assessment and recommendations, at the time. Ms. Rachel Ryan recent genetic test results were disclosed to her, as were recommendations warranted by these results. These results and recommendations are discussed in more detail below.  FAMILY HISTORY:  We obtained a detailed, 4-generation family history.  Significant diagnoses are listed below: Family History  Problem Relation Age of Onset  . Colon polyps Mother     cancerous  . Colon cancer Mother 55  . Breast cancer Paternal Aunt 34  . Breast cancer Paternal Grandfather 29  . Breast cancer Paternal Aunt 93    Genetic testing - MUTYH mutation  . Melanoma Maternal Uncle     dx in his 18s  . Breast cancer Other     MGMs sister dx in her 68s-30s    The patient has one son who is healthy. She had two brothers. One brother died in a car accident at 75 and the other brother is healthy. Her mother was diagnosed with colon cancer at 59, and her father is healthy. Her mother had two brothers, one who had melanoma in his 65s. The patient's father has three sisters and two brothers. One sister had breast cancer at 74, and other sister had breast cancer at 31. This sister reportedly had genetic testing and was found to have an MUTYH mutation. The patient's paternal grandmother had breast cancer in her 64s. Patient's maternal ancestors are of [place] and [place] descent, and paternal ancestors are of [place] and [place] descent. There is no reported Ashkenazi Jewish ancestry. There is no known consanguinity.  GENETIC TEST RESULTS: At the time of Ms. Rachel Ryan's visit, we recommended she pursue genetic testing of the custom gene panel and MSH2  exon 1-7 inversion analysis. The Custom Cancer Panel offered by [facility] includes sequencing and/or deletion duplication testing of the following 34 genes: APC, ATM, AXIN2, BAP1, BARD1, BMPR1A, BRCA1, BRCA2, BRIP1, CDH1, CDK4, CDKN2A, CHEK2, EPCAM, FANCC, MITF, MLH1, MSH2, MSH6, MUTYH, NBN, PALB2, PMS2, POLD1, POLE, PTEN, RAD51C, RAD51D, SCG5/GREM1, SMAD4, STK11, TP53, VHL, and XRCC2.  The report date is April 20, 2016.  Genetic testing was normal, and did not reveal a deleterious mutation in these genes. The test report has been scanned into EPIC and is located under the Molecular Pathology section of the Results Review tab.   We discussed with Ms. Rachel Ryan that since the current genetic testing is not perfect, it is possible there may be a gene mutation in one of these genes that current testing cannot detect, but that chance is small. We also discussed, that it is possible that another gene that has not yet been discovered, or that we have not yet tested, is responsible for the cancer diagnoses in the family, and it is, therefore, important to remain in touch with cancer genetics in the future so that we can continue to offer Ms. Rachel Ryan the most up to date genetic testing.   CANCER SCREENING RECOMMENDATIONS: Given Ms. Rachel Ryan's personal and family histories, we must interpret these negative results with some caution.  Families with features suggestive of hereditary risk for cancer tend to have multiple family members with cancer, diagnoses in multiple generations and diagnoses before the age of 61. Ms. Rachel Ryan family exhibits some of  these features. Thus this result may simply reflect our current inability to detect all mutations within these genes or there may be a different gene that has not yet been discovered or tested. There is a possibility that Ms. Rachel Ryan is a true negative.  Ms. Rachel Ryan is negative for the MUTYH mutation reportedly found in her paternal aunt.  We do not have a copy of the aunt's test  result to confirm this findings.  Ms. Rachel Ryan is an apparent true negative for this hereditary mutation, however, we do not know what the cause of the family history of breast cancer is in the paternal family history, or why her mother was diagnosed with colon cancer at 33.    RECOMMENDATIONS FOR FAMILY MEMBERS: Women in this family might be at some increased risk of developing cancer, over the general population risk, simply due to the family history of cancer. We recommended women in this family have a yearly mammogram beginning at age 53, or 5 years younger than the earliest onset of cancer, an an annual clinical breast exam, and perform monthly breast self-exams. Women in this family should also have a gynecological exam as recommended by their primary provider. All family members should have a colonoscopy by age 52.  Based on Ms. Rachel Ryan's family history, we recommended her mother, who was diagnosed with colon cancer at age 50, have genetic counseling and testing. Ms. Rachel Ryan will let us know if we can be of any assistance in coordinating genetic counseling and/or testing for this family member.   FOLLOW-UP: Lastly, we discussed with Ms. Rachel Ryan that cancer genetics is a rapidly advancing field and it is possible that new genetic tests will be appropriate for her and/or her family members in the future. We encouraged her to remain in contact with cancer genetics on an annual basis so we can update her personal and family histories and let her know of advances in cancer genetics that may benefit this family.   Our contact number was provided. Ms. Rachel Ryan questions were answered to her satisfaction, and she knows she is welcome to call us at anytime with additional questions or concerns.   Roma Kayser, MS, [facility] Certified Genetic Counselor [email or web address]

## 2016-11-19 ENCOUNTER — Encounter (HOSPITAL_COMMUNITY): Payer: Self-pay

## 2017-04-06 ENCOUNTER — Other Ambulatory Visit: Payer: Self-pay | Admitting: Obstetrics and Gynecology

## 2017-04-06 DIAGNOSIS — Z87898 Personal history of other specified conditions: Secondary | ICD-10-CM

## 2017-04-06 DIAGNOSIS — N63 Unspecified lump in unspecified breast: Secondary | ICD-10-CM

## 2017-04-12 ENCOUNTER — Ambulatory Visit
Admission: RE | Admit: 2017-04-12 | Discharge: 2017-04-12 | Disposition: A | Discharge status: Home / Self Care | Payer: BLUE CROSS/BLUE SHIELD | Source: Ambulatory Visit | Attending: Obstetrics and Gynecology | Admitting: Obstetrics and Gynecology

## 2017-04-12 ENCOUNTER — Other Ambulatory Visit: Payer: Self-pay | Admitting: Obstetrics and Gynecology

## 2017-04-12 DIAGNOSIS — Z87898 Personal history of other specified conditions: Secondary | ICD-10-CM

## 2017-04-12 DIAGNOSIS — N63 Unspecified lump in unspecified breast: Secondary | ICD-10-CM

## 2017-04-12 HISTORY — DX: Unspecified lump in unspecified breast: N63.0

## 2017-04-14 ENCOUNTER — Other Ambulatory Visit: Payer: Self-pay | Admitting: Obstetrics and Gynecology

## 2017-04-14 DIAGNOSIS — Z1501 Genetic susceptibility to malignant neoplasm of breast: Secondary | ICD-10-CM

## 2017-04-14 DIAGNOSIS — Z1509 Genetic susceptibility to other malignant neoplasm: Principal | ICD-10-CM

## 2017-07-17 ENCOUNTER — Other Ambulatory Visit: Payer: BLUE CROSS/BLUE SHIELD

## 2017-07-21 ENCOUNTER — Other Ambulatory Visit: Payer: BLUE CROSS/BLUE SHIELD

## 2017-09-03 ENCOUNTER — Other Ambulatory Visit: Payer: BLUE CROSS/BLUE SHIELD

## 2017-12-02 ENCOUNTER — Emergency Department (HOSPITAL_BASED_OUTPATIENT_CLINIC_OR_DEPARTMENT_OTHER)
Admission: EM | Admit: 2017-12-02 | Discharge: 2017-12-02 | Disposition: A | Discharge status: Home / Self Care | Payer: BLUE CROSS/BLUE SHIELD | Attending: Emergency Medicine | Admitting: Emergency Medicine

## 2017-12-02 ENCOUNTER — Other Ambulatory Visit: Payer: Self-pay

## 2017-12-02 ENCOUNTER — Encounter (HOSPITAL_BASED_OUTPATIENT_CLINIC_OR_DEPARTMENT_OTHER): Payer: Self-pay | Admitting: *Deleted

## 2017-12-02 DIAGNOSIS — G43009 Migraine without aura, not intractable, without status migrainosus: Secondary | ICD-10-CM | POA: Diagnosis not present

## 2017-12-02 DIAGNOSIS — F419 Anxiety disorder, unspecified: Secondary | ICD-10-CM | POA: Insufficient documentation

## 2017-12-02 DIAGNOSIS — R51 Headache: Secondary | ICD-10-CM | POA: Diagnosis present

## 2017-12-02 HISTORY — DX: Anxiety disorder, unspecified: F41.9

## 2017-12-02 MED ORDER — KETOROLAC TROMETHAMINE 30 MG/ML IJ SOLN
30.0000 mg | Freq: Once | INTRAMUSCULAR | Status: AC
  Administered 2017-12-02: 30 mg via INTRAVENOUS
  Filled 2017-12-02: qty 1

## 2017-12-02 MED ORDER — SODIUM CHLORIDE 0.9 % IV BOLUS (SEPSIS)
500.0000 mL | Freq: Once | INTRAVENOUS | Status: AC
  Administered 2017-12-02: 500 mL via INTRAVENOUS

## 2017-12-02 MED ORDER — METOCLOPRAMIDE HCL 5 MG/ML IJ SOLN
10.0000 mg | Freq: Once | INTRAMUSCULAR | Status: AC
  Administered 2017-12-02: 10 mg via INTRAVENOUS
  Filled 2017-12-02: qty 2

## 2017-12-02 MED ORDER — LORAZEPAM 2 MG/ML IJ SOLN
0.5000 mg | Freq: Once | INTRAMUSCULAR | Status: AC
  Administered 2017-12-02: 0.5 mg via INTRAVENOUS
  Filled 2017-12-02: qty 1

## 2017-12-02 NOTE — ED Triage Notes (Signed)
Migraine today. She has taken Imitrex x 2 today. States she is having an anxiety attack also.

## 2017-12-02 NOTE — ED Provider Notes (Signed)
Emergency Department Provider Note   I have reviewed the triage vital signs and the nursing notes.   HISTORY  Chief Complaint Migraine   HPI Rachel Ryan is a 42 y.o. female with PMH of migraine HA to the emergency department for evaluation of migraine headache with anxiety symptoms.  Patient states her headache began around noon today.  She took an Imitrex but vomited shortly afterwards.  She began to have increasing anxiety.  Patient then describes taking a second Imitrex along with Xanax but states she has not felt any effects from his medications and her headache is worsening so she came to the ED.  She has some mild to moderate photophobia.  Denies fevers, chills, neck stiffness.  She states this feels very similar to prior migraine headaches that she has had in the past.    Past Medical History:  Diagnosis Date  . Anxiety   . Breast mass    2 lumps lt breast unknown time   . Depressed   . Family history of breast cancer   . Family history of colon cancer   . 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 Migraine     Patient Active Problem List   Diagnosis Date Noted  . Genetic testing 04/23/2016  . Family history of breast cancer   . Family history of colon cancer   . 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 Diarrhea 10/16/2013    Past Surgical History:  Procedure Laterality Date  . COLONOSCOPY    . Overbite    . surgery to correct an overbite    . TONSILLECTOMY      Current Outpatient Rx  . Order #: [id number] Class: Historical Med  . Order #: [id number] Class: Historical Med  . Order #: [id number] Class: Print  . Order #: [id number] Class: Historical Med  . Order #: [id number] Class: Historical Med  . Order #: [id number] Class: Print  . Order #: [id number] Class: Historical Med    Allergies Levofloxacin and Sulfa antibiotics  Family History  Problem Relation Age of Onset  . Colon polyps Mother    cancerous  . Colon cancer Mother 46  . Breast cancer Paternal Aunt 52  . Breast cancer Paternal Grandfather 49  . Breast cancer Paternal Aunt 94       Genetic testing - MUTYH mutation  . Melanoma Maternal Uncle        dx in his 68s  . Breast cancer Other        MGMs sister dx in her 6s-30s    Social History Social History   Tobacco Use  . Smoking status: Never Smoker  . Smokeless tobacco: Never Used  Substance Use Topics  . Alcohol use: No  . Drug use: No    Review of Systems  Constitutional: No fever/chills Eyes: No visual changes. ENT: No sore throat. Cardiovascular: Denies chest pain. Respiratory: Denies shortness of breath. Gastrointestinal: No abdominal pain.  No nausea, no vomiting.  No diarrhea.  No constipation. Genitourinary: Negative for dysuria. Musculoskeletal: Negative for back pain. Skin: Negative for rash. Neurological: Negative for focal weakness or numbness. Positive migraine type HA.   10-point ROS otherwise negative.  ____________________________________________   PHYSICAL EXAM:  VITAL SIGNS: ED Triage Vitals  Enc Vitals Group     BP 12/02/17 1723 (!) 154/75     Pulse Rate 12/02/17 1723 (!) 122     Resp 12/02/17 1723 18     Temp 12/02/17 1723 98.6 F (37 C)  Temp Source 12/02/17 1723 Oral     SpO2 12/02/17 1723 100 %     Weight 12/02/17 1721 140 lb (63.5 kg)     Height 12/02/17 1721 5\' 5"  (1.651 m)     Pain Score 12/02/17 1721 8   Constitutional: Alert and oriented. Sitting in a dark room. No acute distress.  Eyes: Conjunctivae are normal. PERRL. EOMI. Head: Atraumatic. Nose: No congestion/rhinnorhea. Mouth/Throat: Mucous membranes are moist.   Neck: No stridor.  No meningeal signs.  Cardiovascular: Tachycardia. Good peripheral circulation. Grossly normal heart sounds.   Respiratory: Normal respiratory effort.  No retractions. Lungs CTAB. Gastrointestinal: Soft and nontender. No distention.  Musculoskeletal: No lower extremity  tenderness nor edema. No gross deformities of extremities. Neurologic:  Normal speech and language. No gross focal neurologic deficits are appreciated. Normal CN exam 2-12. No pronator drift.  Skin:  Skin is warm, dry and intact. No rash noted.  ____________________________________________  RADIOLOGY  None ____________________________________________   PROCEDURES  Procedure(s) performed:   Procedures  None ____________________________________________   INITIAL IMPRESSION / ASSESSMENT AND PLAN / ED COURSE  Pertinent labs & imaging results that were available during my care of the patient were reviewed by me and considered in my medical decision making (see chart for details).  Patient presents to the emergency department for evaluation of migraine headache with some mild anxiety symptoms.  No fevers or chills.  Feels like prior migraine headaches.  Normal neurological exam.  Plan for treatment of headache symptoms and reassess.  On re-evaluation the patient is far more comfortable. She reports feeling ready to return home for continued supportive care.   At this time, I do not feel there is any life-threatening condition present. I have reviewed and discussed all results (EKG, imaging, lab, urine as appropriate), exam findings with patient. I have reviewed nursing notes and appropriate previous records.  I feel the patient is safe to be discharged home without further emergent workup. Discussed usual and customary return precautions. Patient and family (if present) verbalize understanding and are comfortable with this plan.  Patient will follow-up with their primary care provider. If they do not have a primary care provider, information for follow-up has been provided to them. All questions have been answered.  ____________________________________________  FINAL CLINICAL IMPRESSION(S) / ED DIAGNOSES  Final diagnoses:  Migraine without aura and without status migrainosus, not  intractable     MEDICATIONS GIVEN DURING THIS VISIT:  Medications  sodium chloride 0.9 % bolus 500 mL (0 mLs Intravenous Stopped 12/02/17 1829)  ketorolac (TORADOL) 30 MG/ML injection 30 mg (30 mg Intravenous Given 12/02/17 1756)  metoCLOPramide (REGLAN) injection 10 mg (10 mg Intravenous Given 12/02/17 1758)  LORazepam (ATIVAN) injection 0.5 mg (0.5 mg Intravenous Given 12/02/17 1755)     Note:  This document was prepared using Dragon voice recognition software and may include unintentional dictation errors.  Nanda Quinton, MD Emergency Medicine    Sahil Milner, [facility], MD 12/03/17 [phone]

## 2017-12-02 NOTE — ED Notes (Signed)
Given ginger ale 

## 2017-12-02 NOTE — Discharge Instructions (Signed)

## 2018-06-02 ENCOUNTER — Other Ambulatory Visit: Payer: Self-pay | Admitting: Obstetrics and Gynecology

## 2018-06-02 ENCOUNTER — Telehealth: Payer: Self-pay | Admitting: Internal Medicine

## 2018-06-02 DIAGNOSIS — N63 Unspecified lump in unspecified breast: Secondary | ICD-10-CM

## 2018-06-02 NOTE — Telephone Encounter (Signed)
message left for Rachel Ryan that the patient does not have a recall and would be due for a recall at age 42.  She is asked to call back for any additional questions or concerns.

## 2018-06-07 ENCOUNTER — Ambulatory Visit
Admission: RE | Admit: 2018-06-07 | Discharge: 2018-06-07 | Disposition: A | Discharge status: Home / Self Care | Payer: BLUE CROSS/BLUE SHIELD | Source: Ambulatory Visit | Attending: Obstetrics and Gynecology | Admitting: Obstetrics and Gynecology

## 2018-06-07 ENCOUNTER — Other Ambulatory Visit: Payer: BLUE CROSS/BLUE SHIELD

## 2018-06-07 DIAGNOSIS — N63 Unspecified lump in unspecified breast: Secondary | ICD-10-CM

## 2018-06-14 ENCOUNTER — Other Ambulatory Visit: Payer: Self-pay | Admitting: Obstetrics and Gynecology

## 2018-06-14 DIAGNOSIS — Z803 Family history of malignant neoplasm of breast: Secondary | ICD-10-CM

## 2018-07-01 ENCOUNTER — Ambulatory Visit
Admission: RE | Admit: 2018-07-01 | Discharge: 2018-07-01 | Disposition: A | Discharge status: Home / Self Care | Payer: BLUE CROSS/BLUE SHIELD | Source: Ambulatory Visit | Attending: Obstetrics and Gynecology | Admitting: Obstetrics and Gynecology

## 2018-07-01 DIAGNOSIS — Z803 Family history of malignant neoplasm of breast: Secondary | ICD-10-CM

## 2018-07-01 MED ORDER — GADOBENATE DIMEGLUMINE 529 MG/ML IV SOLN
13.0000 mL | Freq: Once | INTRAVENOUS | Status: AC | PRN
  Administered 2018-07-01: 13 mL via INTRAVENOUS

## 2018-07-14 ENCOUNTER — Ambulatory Visit (INDEPENDENT_AMBULATORY_CARE_PROVIDER_SITE_OTHER): Payer: BLUE CROSS/BLUE SHIELD | Admitting: Psychiatry

## 2018-07-14 DIAGNOSIS — F411 Generalized anxiety disorder: Secondary | ICD-10-CM

## 2018-07-14 NOTE — Progress Notes (Signed)
      [facility] Counselor/Therapist Progress Note   Patient ID: Rachel Ryan, MRN: [id number]  Date: 07/14/2018  Timespent: 60 minutes  Treatment Type: Individual  Subjective: Patient seen today and presents with symptoms of anxiety, anger, frustration, agitation, self-doubt, and worry.  Symptoms are stemming from some ongoing issues within family and in dealing with [facility] system as she and her husband continue to process of foster parenting a 42 yr old girl that they are planning to adopt.  Difficult for patient to not jump to conclusions on emotionally-charged issues as well as issues out of her control.  We talked about the sources of her current distress and helped her re-frame some of her thoughts and ways of viewing situations.  Has a meeting coming up next week with foster care supervisors where hopefully she will get some clarification on some unanswered questions.  Encouraged patient to work on approaching situations looking for something to go well rather than assuming things will go in negative direction and actually gave examples as to how this can benefit her.  Has beach trip scheduled next weekend for her and husband.    Interventions:Solution Focused, Strength-based, Supportive and Reframing  Mental Status Exam:   Appearance:   Neat     Behavior:  Sharing and Agitated  Motor:  Normal  Speech/Language:   Normal Rate  Affect:  Congruent  Mood:  angry, anxious and irritable  Thought process:  Intact  Thought content:    Logical  Perceptual disturbances:    Normal  Orientation:  Full (Time, Place, and Person)  Attention:  Good  Concentration:  good  Memory:  Immediate  Fund of knowledge:   Good  Insight:    Good  Judgment:   Good  Impulse Control:  good    Reported Symptoms: Anger, anxiety, frustration, worry, self-doubt  Risk Assessment: Danger to Self:  No Self-injurious Behavior: No Danger to Others: No Duty to Warn:no Physical Aggression / Violence:No   Access to Firearms a concern: No  Gang Involvement:No   Diagnosis:   ICD-10-CM   1. Generalized anxiety disorder F41.1      Plan: See patient again in approx. 2 weeks to follow up on goal-directed treatment.  Shanon Ace, LCSW

## 2018-07-20 DIAGNOSIS — F458 Other somatoform disorders: Principal | ICD-10-CM

## 2018-07-20 DIAGNOSIS — F411 Generalized anxiety disorder: Secondary | ICD-10-CM | POA: Insufficient documentation

## 2018-07-20 DIAGNOSIS — F419 Anxiety disorder, unspecified: Secondary | ICD-10-CM

## 2018-07-28 ENCOUNTER — Ambulatory Visit: Payer: BLUE CROSS/BLUE SHIELD | Admitting: Psychiatry

## 2018-07-28 DIAGNOSIS — F411 Generalized anxiety disorder: Secondary | ICD-10-CM

## 2018-07-28 NOTE — Progress Notes (Signed)
      [facility] Counselor/Therapist Progress Note   Patient ID: Rachel Ryan, MRN: [id number]  Date: 07/28/2018  Timespent: 45 minutes  Treatment Type: Individual  Subjective:  Patient in today and worked on specific strategies to reduce her habit of worrying which "has gone on for years " per her report.  Is able to see the destructiveness of worry but never really worked on decreasing it in her thoughts as "I saw it in my mother all the time."  Strategies introduced and patient seems motivated to work on them.  Also processed significant communication issues with husband.  Feelings of anticipatory loss as her 42 yr old son will be moving out on his own when 2nd semester of college begins in January 2020.  Goal review and progress noted.  Will see again in 2-3 weeks.  Interventions:Solution Focused, Strength-based, Supportive and Reframing  Mental Status Exam:   Appearance:   Casual     Behavior:  Appropriate  Motor:  Normal  Speech/Language:   Normal Rate  Affect:  Congruent  Mood:  anxious  Thought process:  Coherent  Thought content:    Logical  Perceptual disturbances:    Normal  Orientation:  Full (Time, Place, and Person)  Attention:  Good  Concentration:  good  Memory:  Immediate  Fund of knowledge:   Good  Insight:    Good  Judgment:   Good  Impulse Control:  good    Reported Symptoms: Anxiety, stress, fears, irritability, frustrated  Risk Assessment: Danger to Self:  No Self-injurious Behavior: No Danger to Others: No Duty to Warn:no Physical Aggression / Violence:No  Access to Firearms a concern: No  Gang Involvement:No   Diagnosis:   ICD-10-CM   1. Generalized anxiety disorder F41.1      Plan: Plan to see patient again within 2-3 weeks to continue goal-directed treatment.    Shanon Ace, LCSW

## 2018-08-03 ENCOUNTER — Ambulatory Visit (INDEPENDENT_AMBULATORY_CARE_PROVIDER_SITE_OTHER): Payer: BLUE CROSS/BLUE SHIELD | Admitting: Psychiatry

## 2018-08-03 ENCOUNTER — Encounter: Payer: Self-pay | Admitting: Psychiatry

## 2018-08-03 DIAGNOSIS — F411 Generalized anxiety disorder: Secondary | ICD-10-CM | POA: Diagnosis not present

## 2018-08-03 DIAGNOSIS — G43009 Migraine without aura, not intractable, without status migrainosus: Secondary | ICD-10-CM | POA: Diagnosis not present

## 2018-08-03 MED ORDER — SUMATRIPTAN SUCCINATE 100 MG PO TABS: 100.0000 mg | ORAL_TABLET | ORAL | 6 refills | Status: DC | PRN

## 2018-08-03 MED ORDER — ALPRAZOLAM 1 MG PO TABS: 1.0000 mg | ORAL_TABLET | Freq: Three times a day (TID) | ORAL | 1 refills | Status: DC

## 2018-08-03 MED ORDER — ESCITALOPRAM OXALATE 20 MG PO TABS: 30.0000 mg | ORAL_TABLET | Freq: Every day | ORAL | 1 refills | Status: DC

## 2018-08-03 NOTE — Progress Notes (Signed)
[facility] Med Check  Patient ID: Rachel Ryan,  MRN: [id number]  PCP: Antony Contras, MD  Date of Evaluation: 08/03/2018 Time spent:30 minutes   HISTORY/CURRENT STATUS: HPI CC "a mess" Number of stressors incl efforts to adopt foster child.  Very stressful dealing with DSS and the unknowns.  Occ light panic attacks when feels like will come out of her skin. H is difficult man.  Upset son moving out to college [employer].  Everything left is a stressor.  42yo foster D.  Fear of dying with breast CA. Marland KitchenPt reports that mood is Anxious and trapped. and describes anxiety as Moderate. Anxiety symptoms include: Excessive Worry, Panic Symptoms,. Pt reports NM nightly. Decided against doxazosin bc wanted to avoid another med.  When awakens feels stressed from the [place].  Trapped dreams. 7 hours of sleep.  Pt reports that appetite is good. Pt reports that energy is lethargic and poor motivation. Concentration is good. Suicidal thoughts:  denied by patient. Asks to increase Xanax and to increase imitrex for HA.  Individual Medical History/ Review of Systems: Changes? :Yes 27% risk of breast cancer. Migraine weekly except multiple during period.  Allergies: Levofloxacin and Sulfa antibiotics  Current Medications:  Current Outpatient Medications:  .  ALPRAZolam (XANAX) 1 MG tablet, Take 1 mg by mouth 3 (three) times daily. anxiety , Disp: , Rfl:  .  diphenhydrAMINE (BENADRYL) 25 mg capsule, Take 25 mg by mouth at bedtime as needed. sleep , Disp: , Rfl:  .  escitalopram (LEXAPRO) 20 MG tablet, Take 20 mg by mouth daily. 1.5 daily  (high dose, medically necessary), Disp: , Rfl:  .  Multiple Vitamin (MULTIVITAMIN) tablet, Take 1 tablet by mouth daily.  , Disp: , Rfl:  .  ondansetron (ZOFRAN ODT) 8 MG disintegrating tablet, 8mg  ODT q8 hours prn nausea, Disp: 4 tablet, Rfl: 0 .  SUMAtriptan (IMITREX) 50 MG tablet, Take 50 mg by mouth as needed for migraine (take at onset of HA, may repeat in 1 hr). May  repeat in 2 hours if headache persists or recurs., Disp: , Rfl:  .  Aspirin-Acetaminophen-Caffeine (EXCEDRIN PO), Take 2 tablets by mouth every 8 (eight) hours as needed. headache , Disp: , Rfl:  .  dicyclomine (BENTYL) 20 MG tablet, Take 1 tablet (20 mg total) by mouth 2 (two) times daily. (Patient not taking: Reported on 08/03/2018), Disp: 20 tablet, Rfl: 0 .  doxazosin (CARDURA) 4 MG tablet, Take 4 mg by mouth at bedtime., Disp: , Rfl:  Medication Side Effects: None  Family Medical/ Social History: Changes? Yes cousin stage 4 breast CA at 21 yo.  MENTAL HEALTH EXAM:  There were no vitals taken for this visit.There is no height or weight on file to calculate BMI.  General Appearance: Casual  Eye Contact:  Good  Speech:  Clear and Coherent  Volume:  Normal  Mood:  Anxious  Affect:  Appropriate and anxious  Thought Process:  Goal Directed  Orientation:  Full (Time, Place, and Person)  Thought Content: Logical   Suicidal Thoughts:  No  Homicidal Thoughts:  No  Memory:  Recent  Judgement:  Good  Insight:  Fair  Psychomotor Activity:  Normal  Concentration:  Concentration: Good  Recall:  Good  Fund of Knowledge: Good  Language: Good  Akathisia:  No  AIMS (if indicated): not done  Assets:  Communication Skills Desire for Improvement Housing Talents/Skills Transportation  ADL's:  Intact  Cognition: WNL  Prognosis:  Fair    DIAGNOSES:  ICD-10-CM   1. Generalized anxiety disorder F41.1     RECOMMENDATIONS:  Greater than 50% of face to face time with patient was spent on counseling and coordination of care. We discussed her anxiety and how much of it is expected andh ow much is greater than normal.  She feels she needs more xanax bc takes extra with migraine.   Disc risk of dependence, abuse, tolerance. Use LED. Supportive therapy on sens of loss with son moving out and being left with little positive at home.  Disc potential value to him for this.  Has a close relationship  with son.  Ok increase in Imitrex 100mg .  Disc SE and her fear of this and generally with meds. Refused doxazosing option.   Purnell Shoemaker, MD

## 2018-08-30 ENCOUNTER — Ambulatory Visit: Payer: BLUE CROSS/BLUE SHIELD | Admitting: Psychiatry

## 2018-08-30 DIAGNOSIS — F411 Generalized anxiety disorder: Secondary | ICD-10-CM | POA: Diagnosis not present

## 2018-08-30 NOTE — Progress Notes (Signed)
      [facility] Counselor/Therapist Progress Note   Patient ID: Rachel Ryan, MRN: [id number]  Date: 08/30/2018  Timespent: 60 minutes   Treatment Type: Individual   Reported Symptoms: anxious, sad, fatigue, worried   Mental Status Exam:    Appearance:   Casual     Behavior:  Appropriate and Sharing  Motor:  Normal  Speech/Language:   Normal Rate  Affect:  Depressed  Mood:  anxious and depressed  Thought process:  normal  Thought content:    WNL  Sensory/Perceptual disturbances:    WNL  Orientation:  oriented to person, place, time/date, situation, day of week, month of year and year  Attention:  Good  Concentration:  Good  Memory:  WNL  Fund of knowledge:   Good  Insight:    Good  Judgment:   Good  Impulse Control:  Good     Risk Assessment: Danger to Self:  No Self-injurious Behavior: No Danger to Others: No Duty to Warn:no Physical Aggression / Violence:No  Access to Firearms a concern: No  Gang Involvement:No    Subjective:  Patient in today with symptoms of anxiety, worry, depression, and fatigue.  On Monday, 09/04/18, the hearing for the parental rights termination of patient's foster child is to be held in court.  Processed lots of her stress and concerns about this.  Tends to think the worst and lots of overthinking. Also concerned with oldest son moving out  in January to move into [employer] apt and continue his schooling.  Patient reports sadness and hard to focus on the positives of this situation.  Tried to reframe some of this with her, but the negative overthinking gets in the way.  Strategies discussed re: changing her thinking patterns, which could lead to much less anxiety and more happiness and contentment.   Interventions: Cognitive Behavioral Therapy and Ego-Supportive   Diagnosis:   ICD-10-CM   1. Generalized anxiety disorder F41.1      Plan: Patient to practice strategies discussed in session to help her become more positive  in her thinking and outlook.  Also to work on decreasing her overthinking which tends to happen in all areas of her life.  Encouraged improved self-care (mentally, physically, and emotionally).  Husband taking business trip soon and she is looking forward to having a break on her own.     Shanon Ace, LCSW

## 2018-09-18 ENCOUNTER — Ambulatory Visit: Payer: BLUE CROSS/BLUE SHIELD | Admitting: Psychiatry

## 2018-09-21 ENCOUNTER — Other Ambulatory Visit: Payer: Self-pay

## 2018-09-21 MED ORDER — ALPRAZOLAM 1 MG PO TABS: ORAL_TABLET | ORAL | 1 refills | Status: DC

## 2018-10-16 ENCOUNTER — Telehealth: Payer: Self-pay

## 2018-10-16 NOTE — Telephone Encounter (Signed)
Received fax from [place] denying coverage on her alprazolam 1mg . Fax wasn't clear as to why, called [facility] spoke to them and they stated pt had just fill #240 alprazolam 1mg  on 09/21/2018, and now there was another request for a refill. Pt normally prescribed #180 for 90 day supply. Not sure how pt received higher quantity. Provider notified and noted in pt's chart. Directions are  Alprazolam 1 mg 1/2 tablet every 6 hours prn #180.

## 2018-11-28 ENCOUNTER — Ambulatory Visit: Payer: BLUE CROSS/BLUE SHIELD | Admitting: Psychiatry

## 2018-12-12 ENCOUNTER — Other Ambulatory Visit: Payer: Self-pay | Admitting: Psychiatry

## 2018-12-12 ENCOUNTER — Telehealth: Payer: Self-pay | Admitting: Psychiatry

## 2018-12-12 NOTE — Telephone Encounter (Signed)
See previous phone message. We discussed this when [facility] contacted [place].

## 2018-12-12 NOTE — Telephone Encounter (Signed)
Pt called having issues with getting correct # of pills on refill for Xanax from [facility]. Please return call. States provider changed Rx amount last visit.

## 2018-12-13 NOTE — Telephone Encounter (Signed)
My last note indicated that the Xanax was increased to 1 mg 3 times daily because of a number of stressors that was a causing more anxiety.  Please verify with the patient that that is the dose she is currently taking and we will correct it with [facility].

## 2018-12-14 NOTE — Telephone Encounter (Signed)
Thank you for the clarification.  Therefore the next refill would be due on April 20 or so and the quantity should be changed to 270 to allow for 3 times daily usage.  It is certainly too early to refill now

## 2018-12-14 NOTE — Telephone Encounter (Signed)
Filled #270 on 09/22/2018 Filled #180 on 11/30/2018  pt taking 1mg  Alprazolam tid

## 2018-12-20 ENCOUNTER — Other Ambulatory Visit: Payer: Self-pay

## 2018-12-20 ENCOUNTER — Other Ambulatory Visit: Payer: Self-pay | Admitting: Psychiatry

## 2018-12-20 ENCOUNTER — Telehealth: Payer: Self-pay | Admitting: Psychiatry

## 2018-12-20 MED ORDER — ALPRAZOLAM 1 MG PO TABS: ORAL_TABLET | ORAL | 0 refills | Status: DC

## 2018-12-20 NOTE — Telephone Encounter (Signed)
Updated rx submitted to provider for approval but refill not due till April 20th.

## 2018-12-20 NOTE — Telephone Encounter (Signed)
Patient received Xanax 1 mg #270 on December 13 Then received Xanax 1 mg #180 on November 30, 2018  This means she has used close to her 180 tablets in 20 days which is 6 tablets a day clearly too much and exceeds the recommended dose.  I will no longer give 90-day supplies.  I will give 30-day supply with Xanax 1 mg 4 times daily as needed.  #120  there will be no early refills.  If she cannot stick to this scheduled and she will have to come off the medication.  Lynder Parents, MD, DFAPA

## 2018-12-20 NOTE — Progress Notes (Signed)
As noted elsewhere patient has been exceeding recommended dose of Xanax is been taking up to 6 mg daily.  Will no longer give 90-day refills.  We will reduce the dosage to 1 mg 4 times daily as needed anxiety #120.  No early refills.  If she cannot comply we will have to wean her off the medication.

## 2018-12-20 NOTE — Telephone Encounter (Signed)
Pt called on a 2 way phone call with [facility] stating she had talked to nurse last week about quantity of Xanax that should have been sent in for refill. Dr.Cottle had changed her dose. Should be #270. Please advise [facility] to phone in Rx is [phone]

## 2018-12-20 NOTE — Telephone Encounter (Signed)
I sent the prescription to the [facility] on [address] which was the other pharmacy listed in her pharmacy list.

## 2019-01-03 ENCOUNTER — Other Ambulatory Visit: Payer: Self-pay

## 2019-01-03 ENCOUNTER — Encounter: Payer: Self-pay | Admitting: Psychiatry

## 2019-01-03 ENCOUNTER — Ambulatory Visit (INDEPENDENT_AMBULATORY_CARE_PROVIDER_SITE_OTHER): Payer: BLUE CROSS/BLUE SHIELD | Admitting: Psychiatry

## 2019-01-03 DIAGNOSIS — F411 Generalized anxiety disorder: Secondary | ICD-10-CM

## 2019-01-03 DIAGNOSIS — G43009 Migraine without aura, not intractable, without status migrainosus: Secondary | ICD-10-CM

## 2019-01-03 NOTE — Progress Notes (Signed)
Rachel Ryan [id number] [birth year] 43 y.o.  Subjective:   Patient ID:  Rachel Ryan is a 43 y.o. (DOB Oct 23, 1975) female.  Chief Complaint:  Chief Complaint  Patient presents with  . Anxiety  . Stress  . Medication Problem    Exceeding dose of Xanax prescribed.    HPI Rachel Ryan presents to the office today for follow-up of anxiety.  Last seen in October.  She was under a lot of stress and asking for more Xanax.  Since then she got a 90-day supply of Xanax and it appears she exceeded the dosage recommended taking up to 6 daily.  She then sought an early refill.  She denies this.  She denies exceeding 3 daily average.  She says that [facility] is refilling the wrong dose of 180 ct when it should be #270/90 days for 3 daily.  Rachel Ryan terminated parental rights but everything not done for the adoption. Stress number 1 is H and he's working from home and talks all the time. He thinks I am the fostering.  Admits she's changed for the negative with fostering and it's been very stressful.   Baby not in daycare.  She's almost 3yo.  Trouble getting out of bed at times mainly avoidance.  Don't want to deal with everything.  Pt reports that mood is Anxious and describes anxiety as Moderate. Anxiety symptoms include: Excessive Worry,. Pt reports no sleep issues and Unless H interferes with snoring. Pt reports that appetite is good. Pt reports that energy is poor and no change. Concentration is down slightly. Suicidal thoughts:  denied by patient.  Imitrex makes HA stop.  Frequency mainly menstrual total 6-7 monthly.  Past Psychiatric Medication Trials:  Doxazosin, fluoxetine, sertraline colitis, lexapro 30, buspirone   Review of Systems:  Review of Systems  Neurological: Positive for headaches. Negative for tremors and weakness.  Psychiatric/Behavioral: Negative for agitation, behavioral problems, confusion, decreased concentration, dysphoric mood, hallucinations, self-injury, sleep  disturbance and suicidal ideas. The patient is nervous/anxious. The patient is not hyperactive.     Medications: I have reviewed the patient's current medications.  Current Outpatient Medications  Medication Sig Dispense Refill  . ALPRAZolam (XANAX) 1 MG tablet Take 1/2- 1 tablet every 6 hours as needed for anxiety.  Not to exceed 4 daily 120 tablet 0  . diphenhydrAMINE (BENADRYL) 25 mg capsule Take 25 mg by mouth at bedtime as needed. sleep     . escitalopram (LEXAPRO) 20 MG tablet TAKE ONE AND ONE-HALF TABLETS DAILY 135 tablet 0  . Multiple Vitamin (MULTIVITAMIN) tablet Take 1 tablet by mouth daily.      . ondansetron (ZOFRAN ODT) 8 MG disintegrating tablet 8mg  ODT q8 hours prn nausea 4 tablet 0  . SUMAtriptan (IMITREX) 100 MG tablet Take 1 tablet (100 mg total) by mouth every 2 (two) hours as needed for migraine. May repeat in 2 hours if headache persists or recurs. 30 tablet 6  . Aspirin-Acetaminophen-Caffeine (EXCEDRIN PO) Take 2 tablets by mouth every 8 (eight) hours as needed. headache     . dicyclomine (BENTYL) 20 MG tablet Take 1 tablet (20 mg total) by mouth 2 (two) times daily. (Patient not taking: Reported on 08/03/2018) 20 tablet 0  . doxazosin (CARDURA) 4 MG tablet Take 4 mg by mouth at bedtime.     No current facility-administered medications for this visit.     Medication Side Effects: None  Allergies:  Allergies  Allergen Reactions  . Levofloxacin     Severe headache  .  Sulfa Antibiotics     dizziness    Past Medical History:  Diagnosis Date  . Anxiety   . Breast mass    2 lumps lt breast unknown time   . Depressed   . Family history of breast cancer   . Family history of colon cancer   . 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 Migraine     Family History  Problem Relation Age of Onset  . Colon polyps Mother        cancerous  . Colon cancer Mother 50  . Breast cancer Paternal Aunt 10  . Breast  cancer Paternal Grandfather 26  . Breast cancer Paternal Aunt 28       Genetic testing - MUTYH mutation  . Melanoma Maternal Uncle        dx in his 19s  . Breast cancer Other        MGMs sister dx in her 70s-30s    Social History   Socioeconomic History  . Marital status: Married    Spouse name: Not on file  . Number of children: 1  . Years of education: Not on file  . Highest education level: Not on file  Occupational History  . Occupation: unemployed  Social Needs  . Financial resource strain: Not on file  . Food insecurity:    Worry: Not on file    Inability: Not on file  . Transportation needs:    Medical: Not on file    Non-medical: Not on file  Tobacco Use  . Smoking status: Never Smoker  . Smokeless tobacco: Never Used  Substance and Sexual Activity  . Alcohol use: No  . Drug use: No  . Sexual activity: Yes    Comment: lmp 09/26/13  Lifestyle  . Physical activity:    Days per week: Not on file    Minutes per session: Not on file  . Stress: Not on file  Relationships  . Social connections:    Talks on phone: Not on file    Gets together: Not on file    Attends religious service: Not on file    Active member of club or organization: Not on file    Attends meetings of clubs or organizations: Not on file    Relationship status: Not on file  . Intimate partner violence:    Fear of current or ex partner: Not on file    Emotionally abused: Not on file    Physically abused: Not on file    Forced sexual activity: Not on file  Other Topics Concern  . Not on file  Social History Narrative  . Not on file    Past Medical History, Surgical history, Social history, and Family history were reviewed and updated as appropriate.   Please see review of systems for further details on the patient's review from today.   Objective:   Physical Exam:  There were no vitals taken for this visit.  Physical Exam Constitutional:      General: She is not in acute  distress.    Appearance: She is well-developed.  Musculoskeletal:        General: No deformity.  Neurological:     Mental Status: She is alert and oriented to person, place, and time.     Coordination: Coordination normal.  Psychiatric:        Attention and Perception: Attention and perception normal. She does not perceive auditory or visual hallucinations.  Mood and Affect: Mood is anxious. Mood is not depressed. Affect is not labile, blunt, angry or inappropriate.        Speech: Speech normal.        Behavior: Behavior normal.        Thought Content: Thought content normal. Thought content does not include homicidal or suicidal ideation. Thought content does not include homicidal or suicidal plan.        Cognition and Memory: Cognition and memory normal.        Judgment: Judgment normal.     Comments: Insight intact. No delusions.      Lab Review:     Component Value Date/Time   NA 141 03/02/2014 0120   K 3.8 03/02/2014 0120   CL 103 03/02/2014 0120   CO2 22 03/02/2014 0120   GLUCOSE 110 (H) 03/02/2014 0120   BUN 14 03/02/2014 0120   CREATININE 0.70 03/02/2014 0120   CALCIUM 9.4 03/02/2014 0120   GFRNONAA >90 03/02/2014 0120   GFRAA >90 03/02/2014 0120       Component Value Date/Time   WBC 10.7 (H) 03/02/2014 0120   RBC 3.58 (L) 03/02/2014 0120   HGB 12.1 03/02/2014 0120   HCT 35.5 (L) 03/02/2014 0120   PLT 257 03/02/2014 0120   MCV 99.2 03/02/2014 0120   MCH 33.8 03/02/2014 0120   MCHC 34.1 03/02/2014 0120   RDW 12.2 03/02/2014 0120   LYMPHSABS 1.0 03/02/2014 0120   MONOABS 0.6 03/02/2014 0120   EOSABS 0.0 03/02/2014 0120   BASOSABS 0.0 03/02/2014 0120    No results found for: POCLITH, LITHIUM   No results found for: PHENYTOIN, PHENOBARB, VALPROATE, CBMZ   .res Assessment: Plan:    Generalized anxiety disorder  Migraine without aura and without status migrainosus, not intractable   No med changes today.  Need to fix the [facility] to #270  for xanax 1 mg 1 tablet q 8 hours prn anxiety.  She agrees to the limit of 3 a day.  She denies abusing any alprazolam.  We discussed the short-term risks associated with benzodiazepines including sedation and increased fall risk among others.  Discussed long-term side effect risk including dependence, potential withdrawal symptoms, and the potential eventual dose-related risk of dementia.  Disc ways to deal with the stress of the relationship with H and try to involve him in counseling.  She does not want to change from Lexapro.  We are supplying the Imitrex as needed for headaches and they appear reasonably managed.  FU 6 mo  Lynder Parents, MD, DFAPA   Please see After Visit Summary for patient specific instructions.  No future appointments.  No orders of the defined types were placed in this encounter.     -------------------------------

## 2019-01-11 ENCOUNTER — Other Ambulatory Visit: Payer: Self-pay | Admitting: Psychiatry

## 2019-01-11 MED ORDER — ALPRAZOLAM 1 MG PO TABS: ORAL_TABLET | ORAL | 0 refills | Status: DC

## 2019-02-14 ENCOUNTER — Other Ambulatory Visit: Payer: Self-pay | Admitting: Psychiatry

## 2019-02-14 ENCOUNTER — Telehealth: Payer: Self-pay | Admitting: Psychiatry

## 2019-02-14 MED ORDER — ALPRAZOLAM 1 MG PO TABS: ORAL_TABLET | ORAL | 0 refills | Status: DC

## 2019-02-14 NOTE — Telephone Encounter (Signed)
[name] called to ask for an emergency prescription of her Xanax.  [facility] has a 90 day prescription from 01/11/19 and they are telling her she won't get it until 02/23/19.  She will run out before then. Please call in a local supply to tie her over until her mail order gets here.  Next appt 07/04/19.  [facility] on [address] in [place].

## 2019-02-14 NOTE — Telephone Encounter (Signed)
Done

## 2019-04-04 ENCOUNTER — Other Ambulatory Visit: Payer: Self-pay

## 2019-04-20 ENCOUNTER — Telehealth: Payer: Self-pay | Admitting: Psychiatry

## 2019-04-20 NOTE — Telephone Encounter (Signed)
Pt wants refills  for Alprazolam  to be [facility]. She said it takes them so long she wants it already on file when she runs out of pills the end of July.

## 2019-04-23 NOTE — Telephone Encounter (Signed)
Pended for approval from provider

## 2019-04-23 NOTE — Telephone Encounter (Signed)
It's a controlled substance.  We won't refill it early

## 2019-04-25 ENCOUNTER — Telehealth: Payer: Self-pay | Admitting: Psychiatry

## 2019-04-25 ENCOUNTER — Other Ambulatory Visit: Payer: Self-pay | Admitting: Psychiatry

## 2019-04-25 NOTE — Telephone Encounter (Signed)
Pt requesting a refill on her Xanax. Stated she is aware that it's too early, but she wanted it on file so it wouldn't be any delays like before. Please call.

## 2019-05-01 ENCOUNTER — Telehealth: Payer: Self-pay | Admitting: Psychiatry

## 2019-05-01 NOTE — Telephone Encounter (Signed)
Rachel Ryan needs to get her RF of Xanax sent to [facility]. It takes time to get her RX from [facility] and she tried to get it put on file last week. It is time now to be filled and needs it sent in to [facility]. She is anxious that [facility] will have a delay again.

## 2019-05-03 ENCOUNTER — Other Ambulatory Visit: Payer: Self-pay | Admitting: Psychiatry

## 2019-05-03 MED ORDER — ALPRAZOLAM 1 MG PO TABS: ORAL_TABLET | ORAL | 0 refills | Status: DC

## 2019-05-03 NOTE — Telephone Encounter (Signed)
Refill submitted for Xanax by provider to [facility].

## 2019-05-03 NOTE — Telephone Encounter (Signed)
Not sure why but I do not see it pended under my prescription request.  So I wrote a new 1.  Send it to [facility].

## 2019-05-03 NOTE — Progress Notes (Signed)
Sent in Xanax 1 mg tablets 1/2-1 every 6 hours as needed anxiety or panic with a max of 3 daily.  #270 with no refills sent in to [facility]

## 2019-07-02 ENCOUNTER — Ambulatory Visit: Payer: BLUE CROSS/BLUE SHIELD | Admitting: Psychiatry

## 2019-07-02 ENCOUNTER — Ambulatory Visit (INDEPENDENT_AMBULATORY_CARE_PROVIDER_SITE_OTHER): Payer: BC Managed Care – PPO | Admitting: Psychiatry

## 2019-07-02 DIAGNOSIS — F411 Generalized anxiety disorder: Secondary | ICD-10-CM | POA: Diagnosis not present

## 2019-07-02 NOTE — Progress Notes (Signed)
[facility] Counselor/Therapist Progress Note  Patient ID: Rachel Ryan, MRN: [id number],    Date: 07/02/2019  Time Spent:  45 minutes    10:10am to 10:55am  Treatment Type: Individual Therapy  Virtual Visit Note Connected with patient by a video enabled telemedicine/telehealth application or telephone, with their informed consent, and verified patient privacy and that I am speaking with the correct person using two identifiers. I discussed the limitations, risks, security and privacy concerns of performing psychotherapy and management service by telephone and the availability of in person appointments. I also discussed with the patient that there may be a patient responsible charge related to this service. The patient expressed understanding and agreed to proceed. I discussed the treatment planning with the patient. The patient was provided an opportunity to ask questions and all were answered. The patient agreed with the plan and demonstrated an understanding of the instructions. The patient was advised to call  our office if  symptoms worsen or feel they are in a crisis state and need immediate contact.   Therapist Location: [facility] Patient Location: home   Reported Symptoms:  Anxiety, irritable, some depression, decreased energy,  Mental Status Exam:  Appearance:   n/a   telehealth      Behavior:  Sharing  Motor:  n/a   telehealth  Speech/Language:   Clear and Coherent  Affect:  n/a  telehealth  Mood:  anxious and irritable  Thought process:  goal directed  Thought content:    WNL  Sensory/Perceptual disturbances:    WNL  Orientation:  oriented to person, place, time/date, situation, day of week, month of year and year  Attention:  Good  Concentration:  Good  Memory:  WNL  Fund of knowledge:   Good  Insight:    Good  Judgment:   Good  Impulse Control:  Good   Risk Assessment: Danger to Self:  No Self-injurious Behavior: No Danger to Others:  No Duty to Warn:no Physical Aggression / Violence:No  Access to Firearms a concern: No  Gang Involvement:No   Subjective:  Patient in today reporting anxiety, some depression and decreased energy, and irritability.  Denies any SI.  Adoption of youngest child in home did go through in July 2020 and parental rights of child were terminated.  Child is 43 yrs old and doing well, happy, healthy. Patient reports feeling very attached to this young child and also struggling with some with the "demands on her time and lack of freedom".  *Some guilt feelings about all this. Got child into [facility] but now they're doing that program online for the 43 yr old class. 43  Difficulty dealing with all the pandemic restrictions.  Stressors:  Husband, youngest child, pandemic, civil unrest, have too many dogs (5) but "I can't do anything with them".  Some sleep issues as my "mind can't turn off".  I do take naps during the day sometimes. Thinks she may be becoming pre-menopausal and is scheduling with her ob-gyn soon. Again, she comes back to guilt she is feeling re: issues of adopted child and impact on patient which we talked about more today and will in upcoming session within 2 wks.           Interventions: Cognitive Behavioral Therapy and Solution-Oriented/Positive Psychology  Diagnosis:   ICD-10-CM   1. Generalized anxiety disorder  F41.1     Plan:   Patient not signing tx plan updates on computer screen due to [place].  Treatment Goals: New Goals  07/02/2019 Goals may remain on tx plan as patient works on strategies to meet her goals.  Long term goal: Reduce overall level, frequency, and intensity of the anxiety so that daily functioning is not impaired.  Short term goal:  Increase understanding of beliefs and messages that produce worry and anxiety.  Strategies: 1) Explore cognitive messages that lead to anxiety responses and retrain in adaptive cognitions that do not support anxiety.   2) Assist  patient in becoming aware of key unresolved life conflicts and in  starting to work toward their resolution.    Progress: The goals above are new as of today's session and moving forward.  Patient has a significant conflict that we began today working on so that she can come to some resolution and move forward with self-acceptance and confidence.  Patient is to also monitor her thought patterns and self-talk for negativity and work to at least interrupt such thoughts and self-talk.  Will focus more on replacing them next session.  Patient has been struggling with motivation but did respond well in session today.    Next appt within 1-2 weeks.   Shanon Ace, LCSW

## 2019-07-04 ENCOUNTER — Encounter: Payer: Self-pay | Admitting: Psychiatry

## 2019-07-04 ENCOUNTER — Other Ambulatory Visit: Payer: Self-pay

## 2019-07-04 ENCOUNTER — Ambulatory Visit (INDEPENDENT_AMBULATORY_CARE_PROVIDER_SITE_OTHER): Payer: BC Managed Care – PPO | Admitting: Psychiatry

## 2019-07-04 DIAGNOSIS — F411 Generalized anxiety disorder: Secondary | ICD-10-CM | POA: Diagnosis not present

## 2019-07-04 DIAGNOSIS — G43009 Migraine without aura, not intractable, without status migrainosus: Secondary | ICD-10-CM | POA: Diagnosis not present

## 2019-07-04 DIAGNOSIS — F4001 Agoraphobia with panic disorder: Secondary | ICD-10-CM | POA: Diagnosis not present

## 2019-07-04 MED ORDER — ALPRAZOLAM 1 MG PO TABS: ORAL_TABLET | ORAL | 1 refills | Status: DC

## 2019-07-04 NOTE — Progress Notes (Signed)
Rachel Ryan [id number] 06/26/1976 43 y.o.  Subjective:   Patient ID:  Rachel Ryan is a 43 y.o. (DOB 13-Jul-1976) female.  Chief Complaint:  Chief Complaint  Patient presents with  . Follow-up    Medication Management  . Anxiety    Medication Management    Anxiety Symptoms include nervous/anxious behavior. Patient reports no confusion, decreased concentration or suicidal ideas.     Rachel Ryan presents to the office today for follow-up of anxiety.  When seen seen in October.  She was under a lot of stress and asking for more Xanax.  Since then she got a 90-day supply of Xanax and it appears she exceeded the dosage recommended taking up to 6 daily.  She then sought an early refill.  She denies this.  She denies exceeding 3 daily average.  She says that [facility] is refilling the wrong dose of 180 ct when it should be #270/90 days for 3 daily.  Last visit March 2020.  No meds were changed.  Holding steady.  Trying to manage like everyone else.  Recently reduced Lexapro from 30 mg to 25  Mg daily bc of weight gain and she wornders   Doing OK with the Xanax except wants to change to local pharmacy bc 1-2 week delay getting it causing withdrawal at times. Typically takiing Xanax  2 1/2 daily.  Near panic headed off with the meds with face burning and shakey.  Usually with higher stress or migraines.  They can be severe and fears having to go to the ER. Last Xanax #270 filled 05/08/19.  Average 9 HA monthly last couple of months.  Adoption finally done after 3 years.  H been home working since March, not good.    Rachel Ryan terminated parental rights but everything not done for the adoption. Stress number 1 is H and he's working from home and talks all the time. He thinks I am the fostering.  Admits she's changed for the negative with fostering and it's been very stressful.   Baby not in daycare.  She's almost 3yo.  Trouble getting out of bed at times mainly avoidance.  Don't  want to deal with everything.  Pt reports that mood is Anxious and describes anxiety as Moderate. Anxiety symptoms include: Excessive Worry,. Pt reports no sleep issues and Unless H interferes with snoring. Pt reports that appetite is good. Pt reports that energy is poor and no change. Concentration is down slightly. Suicidal thoughts:  denied by patient.  Imitrex makes HA stop.  Frequency mainly menstrual total 6-7 monthly.  Past Psychiatric Medication Trials:  Doxazosin, fluoxetine, sertraline colitis, lexapro 30, buspirone   Review of Systems:  Review of Systems  Neurological: Positive for headaches. Negative for tremors and weakness.  Psychiatric/Behavioral: Negative for agitation, behavioral problems, confusion, decreased concentration, dysphoric mood, hallucinations, self-injury, sleep disturbance and suicidal ideas. The patient is nervous/anxious. The patient is not hyperactive.     Medications: I have reviewed the patient's current medications.  Current Outpatient Medications  Medication Sig Dispense Refill  . ALPRAZolam (XANAX) 1 MG tablet Take 1/2- 1 tablet every 6 hours as needed for anxiety.  Not to exceed 3 daily 270 tablet 1  . Aspirin-Acetaminophen-Caffeine (EXCEDRIN PO) Take 2 tablets by mouth every 8 (eight) hours as needed. headache     . diphenhydrAMINE (BENADRYL) 25 mg capsule Take 25 mg by mouth at bedtime as needed. sleep     . escitalopram (LEXAPRO) 20 MG tablet TAKE ONE AND ONE-HALF TABLETS DAILY (Patient  taking differently: Take 25 mg by mouth daily. ) 135 tablet 1  . Multiple Vitamin (MULTIVITAMIN) tablet Take 1 tablet by mouth daily.      . SUMAtriptan (IMITREX) 100 MG tablet Take 1 tablet (100 mg total) by mouth every 2 (two) hours as needed for migraine. May repeat in 2 hours if headache persists or recurs. 30 tablet 6   No current facility-administered medications for this visit.     Medication Side Effects: None ?sweating  Allergies:  Allergies  Allergen  Reactions  . Levofloxacin     Severe headache  . Sulfa Antibiotics     dizziness    Past Medical History:  Diagnosis Date  . Anxiety   . Breast mass    2 lumps lt breast unknown time   . Depressed   . Family history of breast cancer   . Family history of colon cancer   . 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 Migraine     Family History  Problem Relation Age of Onset  . Colon polyps Mother        cancerous  . Colon cancer Mother 22  . Breast cancer Paternal Aunt 81  . Breast cancer Paternal Grandfather 56  . Breast cancer Paternal Aunt 15       Genetic testing - MUTYH mutation  . Melanoma Maternal Uncle        dx in his 77s  . Breast cancer Other        MGMs sister dx in her 16s-30s    Social History   Socioeconomic History  . Marital status: Married    Spouse name: Not on file  . Number of children: 1  . Years of education: Not on file  . Highest education level: Not on file  Occupational History  . Occupation: unemployed  Social Needs  . Financial resource strain: Not on file  . Food insecurity    Worry: Not on file    Inability: Not on file  . Transportation needs    Medical: Not on file    Non-medical: Not on file  Tobacco Use  . Smoking status: Never Smoker  . Smokeless tobacco: Never Used  Substance and Sexual Activity  . Alcohol use: No  . Drug use: No  . Sexual activity: Yes    Comment: lmp 09/26/13  Lifestyle  . Physical activity    Days per week: Not on file    Minutes per session: Not on file  . Stress: Not on file  Relationships  . Social Herbalist on phone: Not on file    Gets together: Not on file    Attends religious service: Not on file    Active member of club or organization: Not on file    Attends meetings of clubs or organizations: Not on file    Relationship status: Not on file  . Intimate partner violence    Fear of current or ex partner: Not on file     Emotionally abused: Not on file    Physically abused: Not on file    Forced sexual activity: Not on file  Other Topics Concern  . Not on file  Social History Narrative  . Not on file    Past Medical History, Surgical history, Social history, and Family history were reviewed and updated as appropriate.   Please see review of systems for further details on the patient's review from today.  Objective:   Physical Exam:  There were no vitals taken for this visit.  Physical Exam Constitutional:      General: She is not in acute distress.    Appearance: She is well-developed.  Musculoskeletal:        General: No deformity.  Neurological:     Mental Status: She is alert and oriented to person, place, and time.     Coordination: Coordination normal.  Psychiatric:        Attention and Perception: Attention and perception normal. She does not perceive auditory or visual hallucinations.        Mood and Affect: Mood is anxious. Mood is not depressed. Affect is not labile, blunt, angry or inappropriate.        Speech: Speech normal.        Behavior: Behavior normal.        Thought Content: Thought content normal. Thought content does not include homicidal or suicidal ideation. Thought content does not include homicidal or suicidal plan.        Cognition and Memory: Cognition and memory normal.        Judgment: Judgment normal.     Comments: Insight intact. No delusions.      Lab Review:     Component Value Date/Time   NA 141 03/02/2014 0120   K 3.8 03/02/2014 0120   CL 103 03/02/2014 0120   CO2 22 03/02/2014 0120   GLUCOSE 110 (H) 03/02/2014 0120   BUN 14 03/02/2014 0120   CREATININE 0.70 03/02/2014 0120   CALCIUM 9.4 03/02/2014 0120   GFRNONAA >90 03/02/2014 0120   GFRAA >90 03/02/2014 0120       Component Value Date/Time   WBC 10.7 (H) 03/02/2014 0120   RBC 3.58 (L) 03/02/2014 0120   HGB 12.1 03/02/2014 0120   HCT 35.5 (L) 03/02/2014 0120   PLT 257 03/02/2014 0120    MCV 99.2 03/02/2014 0120   MCH 33.8 03/02/2014 0120   MCHC 34.1 03/02/2014 0120   RDW 12.2 03/02/2014 0120   LYMPHSABS 1.0 03/02/2014 0120   MONOABS 0.6 03/02/2014 0120   EOSABS 0.0 03/02/2014 0120   BASOSABS 0.0 03/02/2014 0120    No results found for: POCLITH, LITHIUM   No results found for: PHENYTOIN, PHENOBARB, VALPROATE, CBMZ   .res Assessment: Plan:    Generalized anxiety disorder - Plan: ALPRAZolam (XANAX) 1 MG tablet  Panic disorder with agoraphobia - Plan: ALPRAZolam (XANAX) 1 MG tablet  Migraine without aura and without status migrainosus, not intractable   Greater than 50% of 30-minute phone to phone time with patient was spent on counseling and coordination of care. We discussed Overall she is doing fairly well with anxiety.  She does still have some panic attacks sometimes are triggered by migraine headaches and sometimes they are just generally stress related but they are not full panic they are typically limited symptom panic attacks.  She does take the Xanax.  She is concerned about her weight.  She is gained weight during COVID.  She wonders if Lexapro could be related and wants to try reducing the dosage.  Some of the stress is improved now that the adoption is completed.  We discussed the risk that her anxiety could worsen and panic could worsen with reduction in Lexapro and if it does we may need to consider other alternatives.  As needed noted above she is failed several other alternatives and packs would not be a good option because it is worse about weight gain.  No med changes today except OK gradual reduction in Lexapro towards a target of 20 mg daily to see if weight gain will be improved.    Continue xanax 1 mg 1 tablet q 8 hours prn anxiety.  She agrees to the limit of 3 a day.  She denies abusing any alprazolam.  We discussed the short-term risks associated with benzodiazepines including sedation and increased fall risk among others.  Discussed long-term  side effect risk including dependence, potential withdrawal symptoms, and the potential eventual dose-related risk of dementia.  She does not want to change from Lexapro right now.    Option switch to Effexor for help with less weight gain.  Disc other alternatives like Trintellix but is less reliably effective for anxiety though it does have less weight gain risk.  Discussed side effects of each of these options in detail.   Disc SE in detail and SSRI withdrawal sx.  Disc with HA frequency she consider Aimovig, Ajovy etc. discussed these medications in detail.  She would likely need to meet criteria by failing a couple of other preventatives such as topiramate etc. before she would qualify.  We discussed other headache preventatives including venlafaxine.  She will consider these.  We are supplying the Imitrex as needed for headaches and they appear reasonably managed.  FU 6 mo  Lynder Parents, MD, DFAPA   Please see After Visit Summary for patient specific instructions.  Future Appointments  Date Time Provider [place]  07/12/2019  1:00 PM Shanon Ace, LCSW [facility] None    No orders of the defined types were placed in this encounter.     -------------------------------

## 2019-07-12 ENCOUNTER — Ambulatory Visit (INDEPENDENT_AMBULATORY_CARE_PROVIDER_SITE_OTHER): Payer: BC Managed Care – PPO | Admitting: Psychiatry

## 2019-07-12 ENCOUNTER — Other Ambulatory Visit: Payer: Self-pay

## 2019-07-12 DIAGNOSIS — F411 Generalized anxiety disorder: Secondary | ICD-10-CM | POA: Diagnosis not present

## 2019-07-12 NOTE — Progress Notes (Signed)
[facility] Counselor/Therapist Progress Note  Patient ID: Rachel Ryan, MRN: [id number],    Date: 07/12/2019  Time Spent:  50 minutes    1:10pm to 2:00pm  Treatment Type: Individual Therapy   Virtual Visit Note Connected with patient by a video enabled telemedicine/telehealth application or telephone, with their informed consent, and verified patient privacy and that I am speaking with the correct person using two identifiers. I discussed the limitations, risks, security and privacy concerns of performing psychotherapy and management service by telephone and the availability of in person appointments. I also discussed with the patient that there may be a patient responsible charge related to this service. The patient expressed understanding and agreed to proceed. I discussed the treatment planning with the patient. The patient was provided an opportunity to ask questions and all were answered. The patient agreed with the plan and demonstrated an understanding of the instructions. The patient was advised to call  our office if  symptoms worsen or feel they are in a crisis state and need immediate contact.   Therapist Location: [facility] Patient Location: home   Reported Symptoms: anxious, depressed,  irritability (especially when things are hectic or loud), some difficulty sleeping but meds help some  Mental Status Exam:  Appearance:   n/a  telehealth     Behavior:  Appropriate and Sharing  Motor:  Normal  Speech/Language:   Clear and Coherent  Affect:  n/a  telehealth  Mood:  anxious and depressed  Thought process:  goal directed  Thought content:    WNL  Sensory/Perceptual disturbances:    WNL  Orientation:  oriented to person, place, time/date, situation, day of week, month of year and year  Attention:  Good  Concentration:  Good  Memory:  WNL  Fund of knowledge:   Good  Insight:    Good  Judgment:   Good  Impulse Control:  Good   Risk  Assessment: Danger to Self:  No Self-injurious Behavior: No Danger to Others: No Duty to Warn:no Physical Aggression / Violence:No  Access to Firearms a concern: No  Gang Involvement:No   Subjective: Patient stressed as pre-K daycare still not opened so she can't take her youngest child there.  Frustrated with chaos in world. Negative. Problems with husband (no closeness, fussing a lot, we have "moments" that things aren't terrible but they're never really good). Irritable, stressed, Anxious,  Depressed (moderate), anxiety stronger than depression but that can change over time depending on what happens.  Lower motivation to do tasks like "cleaning house", but notice she also tends to look at tasks as overwhelming when some tasks could be split up according to room, etc and be more manageable. Frustrations in [place] may consider marital therapy.  Patient states I want to be less miserable, and happy. Denies any SI.  Interventions: CBT and Solution Focused   Diagnosis:   ICD-10-CM   1. Generalized anxiety disorder  F41.1      Plan:   Patient not signing tx plan updates on computer screen due to [place].  Treatment Goals: New Goals  07/02/2019 Goals may remain on tx plan as patient works on strategies to meet her goals.  Long term goal: Reduce overall level, frequency, and intensity of the anxiety so that daily functioning is not impaired.  Short term goal:  Increase understanding of beliefs and messages that produce anxiety and depression.  Strategies: 1) Explore cognitive messages that lead to anxious or depressive responses and retrain in adaptive cognitions  that do not support anxiety or depression. Marland Kitchen   2) Assist patient in becoming aware of key unresolved life conflicts and in  starting to work toward their resolution.    Progress: The goals above are new as of today's session and moving forward.  Patient has a significant conflict that we began today working on so  that she can come to some resolution and move forward with more resolution, peace, and confidence.  Patient is to also monitor her thought patterns and self-talk for negativity and work to at least interrupt such thoughts and self-talk.  Will focus more on replacing them next session.  Patient has been struggling with motivation but did respond well in session today.   Next appt within 2 weeks   Shanon Ace, LCSW

## 2019-07-20 ENCOUNTER — Other Ambulatory Visit: Payer: Self-pay | Admitting: Psychiatry

## 2019-07-20 DIAGNOSIS — F411 Generalized anxiety disorder: Secondary | ICD-10-CM

## 2019-07-20 DIAGNOSIS — F4001 Agoraphobia with panic disorder: Secondary | ICD-10-CM

## 2019-07-25 NOTE — Telephone Encounter (Signed)
He has please check with patient to see where it should be sent because she has had complaints about her prescriptions before.

## 2019-07-25 NOTE — Telephone Encounter (Signed)
Pt. Uses [facility] for her Xanax and [facility] for her other medications. She has trouble getting her Xanax from [facility] in a timely manner.

## 2019-07-25 NOTE — Telephone Encounter (Signed)
Rachel Ryan, please call patient to see if she's using [facility]? Dr. Clovis Pu submitted this on 09/23 to [facility] but according to PMP not filled yet.

## 2019-07-26 ENCOUNTER — Other Ambulatory Visit: Payer: Self-pay | Admitting: Psychiatry

## 2019-07-26 NOTE — Telephone Encounter (Signed)
According to the chart the patient has a refill at [facility].  Please call [facility] on [address] and verify this information.

## 2019-07-27 ENCOUNTER — Other Ambulatory Visit: Payer: Self-pay | Admitting: Psychiatry

## 2019-07-27 DIAGNOSIS — G43009 Migraine without aura, not intractable, without status migrainosus: Secondary | ICD-10-CM

## 2019-07-30 ENCOUNTER — Other Ambulatory Visit: Payer: Self-pay

## 2019-07-30 ENCOUNTER — Ambulatory Visit (INDEPENDENT_AMBULATORY_CARE_PROVIDER_SITE_OTHER): Payer: BC Managed Care – PPO | Admitting: Psychiatry

## 2019-07-30 DIAGNOSIS — F411 Generalized anxiety disorder: Secondary | ICD-10-CM | POA: Diagnosis not present

## 2019-07-30 NOTE — Progress Notes (Signed)
[facility] Counselor/Therapist Progress Note  Patient ID: Rachel Ryan, MRN: [id number],    Date: 07/30/2019  Time Spent: 60 minutes   3:00pm to 4:00pm  Virtual Visit Note Connected with patient by a video enabled telemedicine/telehealth application or telephone, with their informed consent, and verified patient privacy and that I am speaking with the correct person using two identifiers. I discussed the limitations, risks, security and privacy concerns of performing psychotherapy and management service by telephone and the availability of in person appointments. I also discussed with the patient that there may be a patient responsible charge related to this service. The patient expressed understanding and agreed to proceed. I discussed the treatment planning with the patient. The patient was provided an opportunity to ask questions and all were answered. The patient agreed with the plan and demonstrated an understanding of the instructions. The patient was advised to call  our office if  symptoms worsen or feel they are in a crisis state and need immediate contact.   Therapist Location: [facility] Patient Location: home   Treatment Type: Individual Therapy  Reported Symptoms: anxiety, irritability, lower energy, depression, "tired of everybody being at home in same house"  Mental Status Exam:  Appearance:   n/a   telehealth     Behavior:  Sharing  Motor:  n/a  telehealth  Speech/Language:   Normal Rate  Affect:  n/a   telehealth  Mood:  anxious, depressed and irritable  Thought process:  goal directed  Thought content:    WNL  Sensory/Perceptual disturbances:    WNL  Orientation:  oriented to person, place, time/date, situation, day of week, month of year and year  Attention:  Good  Concentration:  Good  Memory:  WNL  Fund of knowledge:   Good  Insight:    Good  Judgment:   Patient reports "fair" and explains sometimes she doesn't think through decisions  well  Impulse Control:  Good   Risk Assessment: Danger to Self:  No Self-injurious Behavior: No Danger to Others: No Duty to Warn:no Physical Aggression / Violence:No  Access to Firearms a concern: No  Gang Involvement:No   Subjective:  Patient today reports symptoms above and feels that a lot of her distress is due to the conditions of the pandemic, having a toddler at home, having all the family at home during this time, and all the mean-ness in the world.  Feels overwhelmed at times.  Denies any SI.    Interventions: Cognitive Behavioral Therapy and Solution-Oriented/Positive Psychology  Diagnosis:   ICD-10-CM   1. Generalized anxiety disorder  F41.1     Plan: Patient not signing tx plan updates on computer screen due to [place].  Treatment Goals:New Goals 07/02/2019 Goals may remain on tx plan as patient works on strategies to meet her goals.  Long term goal: Reduce overall level, frequency, and intensity of the anxiety so that daily functioning is not impaired.  Short term goal: Increase understanding of beliefs and messages that produce anxiety and depression.  Strategies: 1) Explore cognitive messages that lead to anxious or depressive responses and retrain in adaptive cognitions that do not support anxiety or depression. Marland Kitchen  2)Assist patient in becoming aware of key unresolved life conflicts and in  starting to work toward their resolution.  Progress: Patient admits today she's had a difficult time sticking with her strategies and goals.  Still feels they are accurate for her needs but her motivation has been lower during this pandemic time and  there are several issues at home.  Have encouraged possible marital counseling for she and husband with another therapist and she is going to speak with husband about that. Continued our work today on her thought patterns and how they affect her feelings and outlook.  Discussed her motivation level and what seems to  help her feel more motivated at times.  Having some "alone time and getting more help at home with tasks" she feels would help right now. Talked about ways she can ask other family members for help needed, and also worked on not assuming "the worst".  She seemed to be receptive and is to follow up on asking for help in some specific ways. Pointed out to patient how her mood seemed to lighten a bit as we spoke about options rather than there being "no way to get what she wanted/needed".  Worked some on this issue as we looked at her thought patterns and how they do tend to often go in a negative, anxious, depressive direction. Challenged her to be monitoring her thoughts and interrupting the negative/anxious/depressive pattern and work to replace them with more reality-based, hopeful, and self-affirming thoughts.  Gave a couple examples in session today.  To follow up on this and on her asking for help at home, in next session. Goal review and efforts validated with patient.     Next appt within 2 weeks.   Shanon Ace, LCSW

## 2019-07-30 NOTE — Telephone Encounter (Signed)
This is a high priority.  Make sure you call the [facility] on [address] and verify my previous message that she has refills.  Let me know

## 2019-07-31 NOTE — Telephone Encounter (Signed)
Spoke with pharmacy and she does have a refill. It is ready for her to pick up.

## 2019-08-22 ENCOUNTER — Ambulatory Visit: Payer: BC Managed Care – PPO | Admitting: Psychiatry

## 2019-08-30 ENCOUNTER — Other Ambulatory Visit: Payer: Self-pay | Admitting: Obstetrics and Gynecology

## 2019-08-30 DIAGNOSIS — Z1231 Encounter for screening mammogram for malignant neoplasm of breast: Secondary | ICD-10-CM

## 2019-10-24 ENCOUNTER — Ambulatory Visit: Payer: BLUE CROSS/BLUE SHIELD

## 2019-11-22 ENCOUNTER — Other Ambulatory Visit: Payer: Self-pay | Admitting: Obstetrics and Gynecology

## 2019-11-22 ENCOUNTER — Other Ambulatory Visit: Payer: Self-pay | Admitting: Psychiatry

## 2019-11-22 DIAGNOSIS — N6002 Solitary cyst of left breast: Secondary | ICD-10-CM

## 2019-11-22 DIAGNOSIS — N6001 Solitary cyst of right breast: Secondary | ICD-10-CM

## 2019-11-22 NOTE — Telephone Encounter (Signed)
Check her dose

## 2019-11-28 ENCOUNTER — Ambulatory Visit: Payer: Self-pay

## 2019-11-28 NOTE — Telephone Encounter (Signed)
Looks like Albina Billet noted taking 25 mg? Not 30

## 2019-12-14 ENCOUNTER — Other Ambulatory Visit: Payer: Self-pay

## 2020-01-07 ENCOUNTER — Other Ambulatory Visit: Payer: Self-pay | Admitting: Psychiatry

## 2020-01-07 DIAGNOSIS — F411 Generalized anxiety disorder: Secondary | ICD-10-CM

## 2020-01-07 DIAGNOSIS — F4001 Agoraphobia with panic disorder: Secondary | ICD-10-CM

## 2020-01-07 NOTE — Telephone Encounter (Signed)
Last refill 10/17/2019 #270 Has apt 03/30

## 2020-01-09 ENCOUNTER — Other Ambulatory Visit: Payer: Self-pay

## 2020-01-09 ENCOUNTER — Ambulatory Visit (INDEPENDENT_AMBULATORY_CARE_PROVIDER_SITE_OTHER): Payer: BC Managed Care – PPO | Admitting: Psychiatry

## 2020-01-09 ENCOUNTER — Encounter: Payer: Self-pay | Admitting: Psychiatry

## 2020-01-09 DIAGNOSIS — F411 Generalized anxiety disorder: Secondary | ICD-10-CM | POA: Diagnosis not present

## 2020-01-09 DIAGNOSIS — G43009 Migraine without aura, not intractable, without status migrainosus: Secondary | ICD-10-CM | POA: Diagnosis not present

## 2020-01-09 DIAGNOSIS — F4001 Agoraphobia with panic disorder: Secondary | ICD-10-CM

## 2020-01-09 MED ORDER — SUMATRIPTAN SUCCINATE 100 MG PO TABS: ORAL_TABLET | ORAL | 8 refills | Status: DC

## 2020-01-09 MED ORDER — ARIPIPRAZOLE 5 MG PO TABS: ORAL_TABLET | ORAL | 1 refills | Status: DC

## 2020-01-09 MED ORDER — ESCITALOPRAM OXALATE 20 MG PO TABS: 30.0000 mg | ORAL_TABLET | Freq: Every day | ORAL | 1 refills | Status: DC

## 2020-01-09 NOTE — Progress Notes (Signed)
Rachel Ryan:[id number] May 01, 1976 44 y.o.  Subjective:   Patient ID:  Rachel Ryan is a 44 y.o. (DOB 02/05/1976) female.  Chief Complaint:  Chief Complaint  Patient presents with  . Anxiety  . Headache  . Follow-up    Med changes    Anxiety Symptoms include nervous/anxious behavior. Patient reports no confusion, decreased concentration, palpitations or suicidal ideas.     Rachel Ryan presents to the office today for follow-up of anxiety and migraine.  When seen seen in October.  She was under a lot of stress and asking for more Xanax.  Since then she got a 90-day supply of Xanax and it appears she exceeded the dosage recommended taking up to 6 daily.  She then sought an early refill.  She denies this.  She denies exceeding 3 daily average.  She says that [facility] is refilling the wrong dose of 180 ct when it should be #270/90 days for 3 daily.  Last visit September 2020.  She was doing relatively well emotionally but was concerned about her weight.  Therefore we made the following decision: No med changes today except OK gradual reduction in Lexapro towards a target of 20 mg daily to see if weight gain will be improved.    Went down to 20 mg Lexapro for a month but very irritable and angry so increased it back to 30 mg and it's better but not gone.  No energy, don't want to do anything and is irritable.  Situational depression.  Main stress H staying at home all the time.  Daycare and school out a lot so everyone all together all the time and hard on her if doesn't get alone time.  H works from home.  Adopted D just started back in person pre-K.  Worries lockdown hurting the kids and her marriage.  Average worse up to 14-15  HA monthly last couple of months.  Adoption finally done after 3 years.  H been home working since March, not good.  Gets on her nerves.    Pt reports that mood is Anxious and describes anxiety as Moderate. Anxiety symptoms include: Excessive  Worry,. Pt reports no sleep issues and Unless H interferes with snoring. Pt reports that appetite is good. Pt reports that energy is poor and no change. Concentration is down slightly. Suicidal thoughts:  denied by patient.  Imitrex makes HA stop.  Frequency mainly menstrual total 6-7 monthly.  Past Psychiatric Medication Trials:  Doxazosin,  fluoxetine, sertraline colitis, lexapro 30,  buspirone   Review of Systems:  Review of Systems  Cardiovascular: Negative for palpitations.  Neurological: Positive for headaches. Negative for tremors and weakness.  Psychiatric/Behavioral: Negative for agitation, behavioral problems, confusion, decreased concentration, dysphoric mood, hallucinations, self-injury, sleep disturbance and suicidal ideas. The patient is nervous/anxious. The patient is not hyperactive.     Medications: I have reviewed the patient's current medications.  Current Outpatient Medications  Medication Sig Dispense Refill  . ALPRAZolam (XANAX) 1 MG tablet Take 1/2- 1 tablet every 6 hours as needed for anxiety.  Not to exceed 3 daily 270 tablet 1  . Aspirin-Acetaminophen-Caffeine (EXCEDRIN PO) Take 2 tablets by mouth every 8 (eight) hours as needed. headache     . diphenhydrAMINE (BENADRYL) 25 mg capsule Take 25 mg by mouth at bedtime as needed. sleep     . escitalopram (LEXAPRO) 20 MG tablet TAKE ONE AND ONE-HALF TABLETS DAILY 135 tablet 1  . Multiple Vitamin (MULTIVITAMIN) tablet Take 1 tablet by mouth daily.      Marland Kitchen  SUMAtriptan (IMITREX) 100 MG tablet TAKE 1 TABLET EVERY 2 HOURS AS NEEDED FOR MIGRAINE. MAY REPEAT IN 2 HOURS IF HEADACHE PERSISTS OR RECURS 27 tablet 8   No current facility-administered medications for this visit.    Medication Side Effects: None ?sweating  Allergies:  Allergies  Allergen Reactions  . Levofloxacin     Severe headache  . Sulfa Antibiotics     dizziness    Past Medical History:  Diagnosis Date  . Anxiety   . Breast mass    2 lumps lt  breast unknown time   . Depressed   . Family history of breast cancer   . Family history of colon cancer   . 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 Migraine     Family History  Problem Relation Age of Onset  . Colon polyps Mother        cancerous  . Colon cancer Mother 71  . Breast cancer Paternal Aunt 71  . Breast cancer Paternal Grandfather 68  . Breast cancer Paternal Aunt 48       Genetic testing - MUTYH mutation  . Melanoma Maternal Uncle        dx in his 8s  . Breast cancer Other        MGMs sister dx in her 39s-30s    Social History   Socioeconomic History  . Marital status: Married    Spouse name: Not on file  . Number of children: 1  . Years of education: Not on file  . Highest education level: Not on file  Occupational History  . Occupation: unemployed  Tobacco Use  . Smoking status: Never Smoker  . Smokeless tobacco: Never Used  Substance and Sexual Activity  . Alcohol use: No  . Drug use: No  . Sexual activity: Yes    Comment: lmp 09/26/13  Other Topics Concern  . Not on file  Social History Narrative  . Not on file   Social Determinants of Health   Financial Resource Strain:   . Difficulty of Paying Living Expenses:   Food Insecurity:   . Worried About Charity fundraiser in the Last Year:   . Arboriculturist in the Last Year:   Transportation Needs:   . Film/video editor (Medical):   Marland Kitchen Lack of Transportation (Non-Medical):   Physical Activity:   . Days of Exercise per Week:   . Minutes of Exercise per Session:   Stress:   . Feeling of Stress :   Social Connections:   . Frequency of Communication with Friends and Family:   . Frequency of Social Gatherings with Friends and Family:   . Attends Religious Services:   . Active Member of Clubs or Organizations:   . Attends Archivist Meetings:   Marland Kitchen Marital Status:   Intimate Partner Violence:   . Fear of Current or  Ex-Partner:   . Emotionally Abused:   Marland Kitchen Physically Abused:   . Sexually Abused:     Past Medical History, Surgical history, Social history, and Family history were reviewed and updated as appropriate.   Please see review of systems for further details on the patient's review from today.   Objective:   Physical Exam:  There were no vitals taken for this visit.  Physical Exam Constitutional:      General: She is not in acute distress.    Appearance: She is well-developed.  Musculoskeletal:  General: No deformity.  Neurological:     Mental Status: She is alert and oriented to person, place, and time.     Coordination: Coordination normal.  Psychiatric:        Attention and Perception: Attention and perception normal. She does not perceive auditory or visual hallucinations.        Mood and Affect: Mood is anxious. Mood is not depressed. Affect is not labile, blunt, angry or inappropriate.        Speech: Speech normal.        Behavior: Behavior normal.        Thought Content: Thought content normal. Thought content does not include homicidal or suicidal ideation. Thought content does not include homicidal or suicidal plan.        Cognition and Memory: Cognition and memory normal.        Judgment: Judgment normal.     Comments: Insight intact. No delusions.  Irritable.     Lab Review:     Component Value Date/Time   NA 141 03/02/2014 0120   K 3.8 03/02/2014 0120   CL 103 03/02/2014 0120   CO2 22 03/02/2014 0120   GLUCOSE 110 (H) 03/02/2014 0120   BUN 14 03/02/2014 0120   CREATININE 0.70 03/02/2014 0120   CALCIUM 9.4 03/02/2014 0120   GFRNONAA >90 03/02/2014 0120   GFRAA >90 03/02/2014 0120       Component Value Date/Time   WBC 10.7 (H) 03/02/2014 0120   RBC 3.58 (L) 03/02/2014 0120   HGB 12.1 03/02/2014 0120   HCT 35.5 (L) 03/02/2014 0120   PLT 257 03/02/2014 0120   MCV 99.2 03/02/2014 0120   MCH 33.8 03/02/2014 0120   MCHC 34.1 03/02/2014 0120   RDW  12.2 03/02/2014 0120   LYMPHSABS 1.0 03/02/2014 0120   MONOABS 0.6 03/02/2014 0120   EOSABS 0.0 03/02/2014 0120   BASOSABS 0.0 03/02/2014 0120    No results found for: POCLITH, LITHIUM   No results found for: PHENYTOIN, PHENOBARB, VALPROATE, CBMZ   .res Assessment: Plan:    Generalized anxiety disorder  Panic disorder with agoraphobia  Migraine without aura and without status migrainosus, not intractable   Greater than 50% of 30-minute phone to phone time with patient was spent on counseling and coordination of care.  She does still have some panic attacks sometimes are triggered by migraine headaches and sometimes they are just generally stress related but they are not full panic they are typically limited symptom panic attacks.  She does take the Xanax. Overall she is more irritable and stressed related to Covid with some degree of depression as noted.  Discussed augmentation strategies.  We cannot go higher in the Lexapro.  She prefers not to switch medicines which is another strategy.  Consider aripiprazole or lithium augmentation.  Discussed side effects in detail.  Aripiprazole is likely to help her energy mood and motivation and may help with irritability as well. She will consider aripiprazole 2.5 to 5 mg daily.  Prescription printed for her to to decide about filling later.  She asks about increasing Xanax.  We would prefer to avoid that.  She appears to have some degree of tolerance developing and this was discussed. Continue xanax 1 mg 1 tablet q 8 hours prn anxiety.  She agrees to the limit of 3 a day. She is taking 3 mg daily.  She denies SE.   She denies abusing any alprazolam.  We discussed the short-term risks associated with benzodiazepines including sedation  and increased fall risk among others.  Discussed long-term side effect risk including dependence, potential withdrawal symptoms, and the potential eventual dose-related risk of dementia. Newer studies refute that.    She does not want to change from Lexapro right now.    Option switch to Effexor for help with less weight gain.  Disc other alternatives like Trintellix but is less reliably effective for anxiety though it does have less weight gain risk.  Discussed side effects of each of these options in detail.   Disc SE in detail and SSRI withdrawal sx.   Disc with HA frequency she consider Aimovig, Ajovy etc. she is having more headaches but it is likely stress related.  Discussed these medications in detail.  She would likely need to meet criteria by failing a couple of other preventatives such as topiramate etc. before she would qualify.  We discussed other headache preventatives including venlafaxine.  She will consider these.  We are supplying the Imitrex as needed for headaches and they appear reasonably managed.  FU 6 mo  Lynder Parents, MD, DFAPA   Please see After Visit Summary for patient specific instructions.  Future Appointments  Date Time Provider [place]  01/10/2020  9:20 AM [facility] DI[facility] [place] 1 [facility]  01/10/2020  9:30 AM [facility] [place] 1 [facility]  01/10/2020  9:35 AM [facility] [place] 1 [facility]    No orders of the defined types were placed in this encounter.     -------------------------------

## 2020-01-10 ENCOUNTER — Ambulatory Visit: Admission: RE | Admit: 2020-01-10 | Payer: Self-pay | Source: Ambulatory Visit

## 2020-01-10 ENCOUNTER — Ambulatory Visit
Admission: RE | Admit: 2020-01-10 | Discharge: 2020-01-10 | Disposition: A | Discharge status: Home / Self Care | Payer: BC Managed Care – PPO | Source: Ambulatory Visit | Attending: Obstetrics and Gynecology | Admitting: Obstetrics and Gynecology

## 2020-01-10 DIAGNOSIS — N6001 Solitary cyst of right breast: Secondary | ICD-10-CM

## 2020-01-15 ENCOUNTER — Other Ambulatory Visit: Payer: Self-pay | Admitting: Obstetrics and Gynecology

## 2020-01-15 DIAGNOSIS — Z803 Family history of malignant neoplasm of breast: Secondary | ICD-10-CM

## 2020-02-04 ENCOUNTER — Other Ambulatory Visit: Payer: Self-pay | Admitting: Obstetrics and Gynecology

## 2020-02-04 ENCOUNTER — Ambulatory Visit
Admission: RE | Admit: 2020-02-04 | Discharge: 2020-02-04 | Disposition: A | Discharge status: Home / Self Care | Payer: BC Managed Care – PPO | Source: Ambulatory Visit | Attending: Obstetrics and Gynecology | Admitting: Obstetrics and Gynecology

## 2020-02-04 ENCOUNTER — Other Ambulatory Visit: Payer: Self-pay

## 2020-02-04 DIAGNOSIS — R9389 Abnormal findings on diagnostic imaging of other specified body structures: Secondary | ICD-10-CM

## 2020-02-04 DIAGNOSIS — Z803 Family history of malignant neoplasm of breast: Secondary | ICD-10-CM

## 2020-02-04 MED ORDER — GADOBUTROL 1 MMOL/ML IV SOLN
7.0000 mL | Freq: Once | INTRAVENOUS | Status: AC | PRN
  Administered 2020-02-04: 7 mL via INTRAVENOUS

## 2020-02-18 ENCOUNTER — Other Ambulatory Visit: Payer: Self-pay

## 2020-02-18 DIAGNOSIS — G43009 Migraine without aura, not intractable, without status migrainosus: Secondary | ICD-10-CM

## 2020-02-18 MED ORDER — ARIPIPRAZOLE 5 MG PO TABS: ORAL_TABLET | ORAL | 3 refills | Status: DC

## 2020-02-19 ENCOUNTER — Other Ambulatory Visit: Payer: BC Managed Care – PPO

## 2020-02-21 ENCOUNTER — Other Ambulatory Visit: Payer: Self-pay

## 2020-02-21 ENCOUNTER — Other Ambulatory Visit (HOSPITAL_COMMUNITY): Payer: Self-pay | Admitting: Diagnostic Radiology

## 2020-02-21 ENCOUNTER — Ambulatory Visit
Admission: RE | Admit: 2020-02-21 | Discharge: 2020-02-21 | Disposition: A | Discharge status: Home / Self Care | Payer: BC Managed Care – PPO | Source: Ambulatory Visit | Attending: Obstetrics and Gynecology | Admitting: Obstetrics and Gynecology

## 2020-02-21 DIAGNOSIS — R9389 Abnormal findings on diagnostic imaging of other specified body structures: Secondary | ICD-10-CM

## 2020-02-21 MED ORDER — GADOBUTROL 1 MMOL/ML IV SOLN
7.0000 mL | Freq: Once | INTRAVENOUS | Status: AC | PRN
  Administered 2020-02-21: 7 mL via INTRAVENOUS

## 2020-03-04 ENCOUNTER — Other Ambulatory Visit: Payer: BC Managed Care – PPO

## 2020-04-15 IMAGING — MR MR BILATERAL BREAST WITHOUT AND WITH CONTRAST
8 of 12 series · 32 of 48 positions shown · IV contrast (13ml Multihance)
Comparison: Bilateral diagnostic mammogram and bilateral breast
ultrasound June 07, 2018.

CLINICAL DATA: Family history of breast cancer in paternal
grandmother, and 2 paternal aunts, 1 of whom was diagnosed at age
43, premenopausal. Known history of multiple bilateral breast cysts.
Patient reports today history technologist a history of bilateral
brownish nipple discharge.

EXAM:
BILATERAL BREAST MRI WITH AND WITHOUT CONTRAST
TECHNIQUE: Multiplanar, multisequence MR images of both breasts were obtained
prior to and following the intravenous administration of 13 ml of
MultiHance.
Three-dimensional MR images were rendered by post-processing the
original MR data using the DynaCAD thin client. The 3D MR images are
interpreted and the findings are included in the complete MRI report
below.

[Series 3: t2_tirm_tra ipat (a-p) · axial · 3.0mm · 0.62mm/px · 1 of 61 slices shown]
[im 1/61]
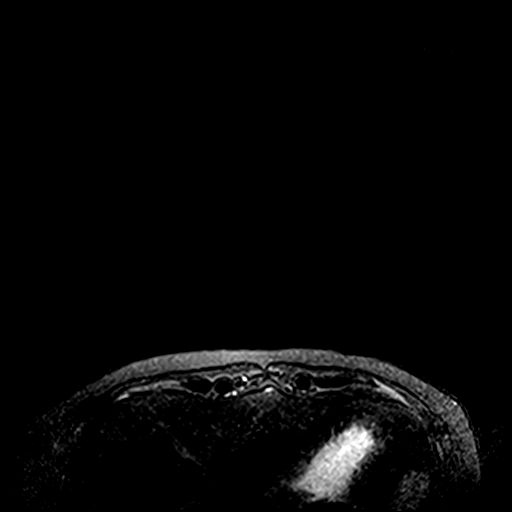

[Series 4: fl3d pre-cm no · axial · non-contrast · 1.2mm · 0.83mm/px · z∈[-74,+117]mm · 5 of 160 slices shown]
[im 1/160]
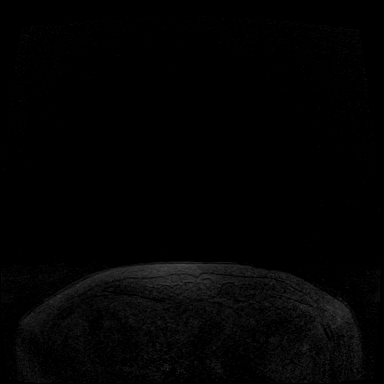
[im 40/160]
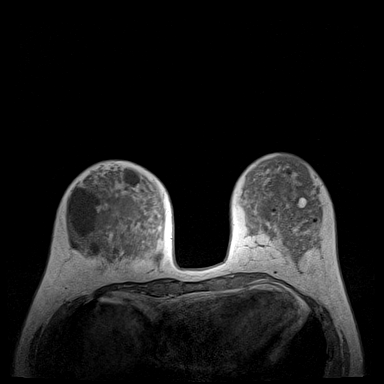
[im 80/160]
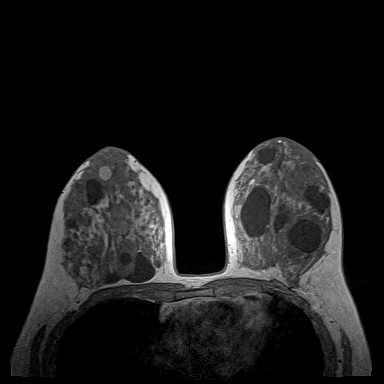
[im 120/160]
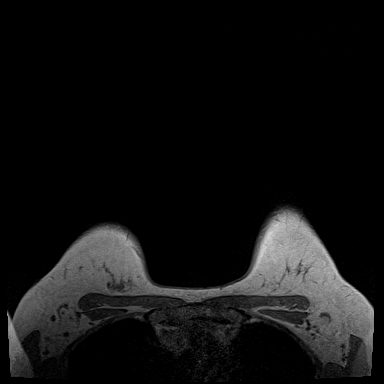
[im 160/160]
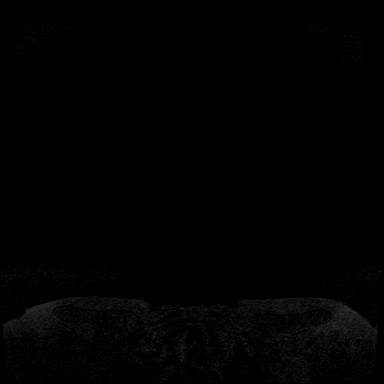

[Series 5: fl3d pre-cm · axial · non-contrast · 1.2mm · 0.83mm/px · z∈[-74,+117]mm · 5 of 160 slices shown]
[im 1/160]
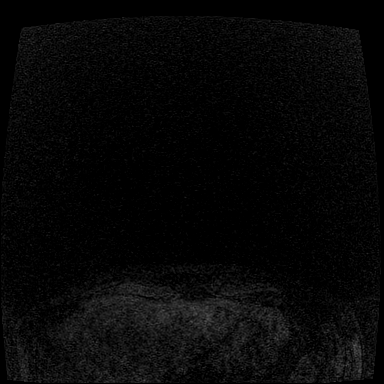
[im 40/160]
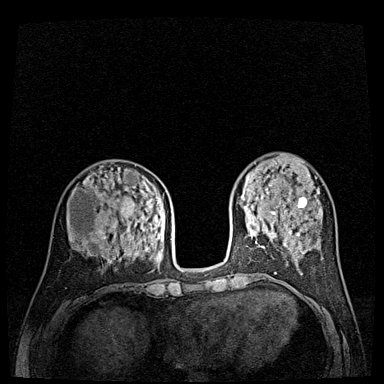
[im 80/160]
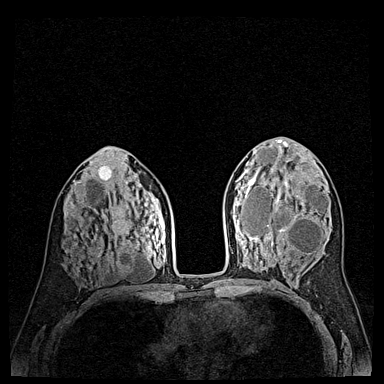
[im 120/160]
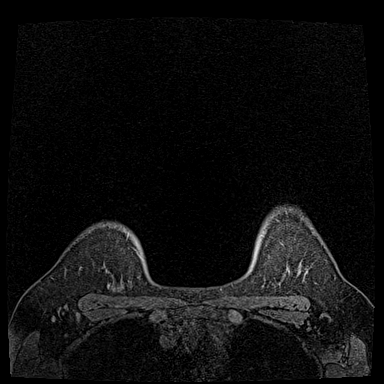
[im 160/160]
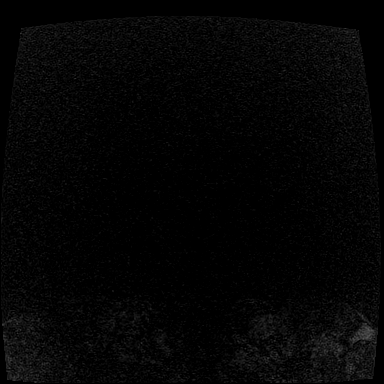

[Series 6: fl3d post-cm 20 · axial · 1.2mm · 0.83mm/px · z∈[-74,+117]mm · 5 of 160 slices shown (1 of 3)]
[im 1/160]
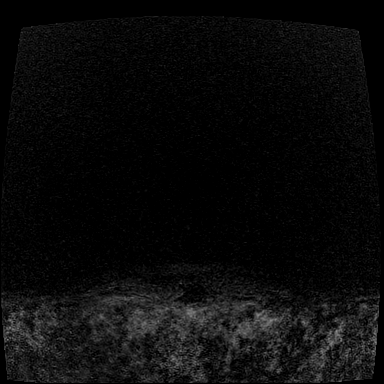
[im 40/160]
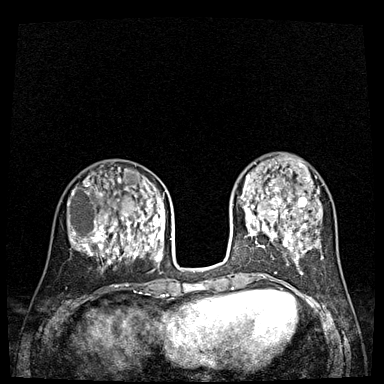
[im 80/160]
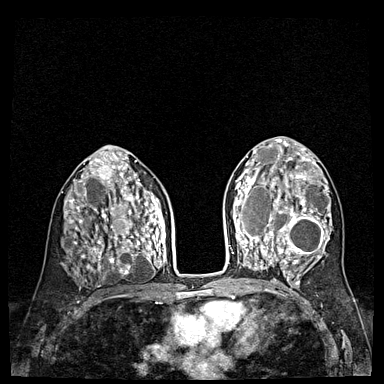
[im 120/160]
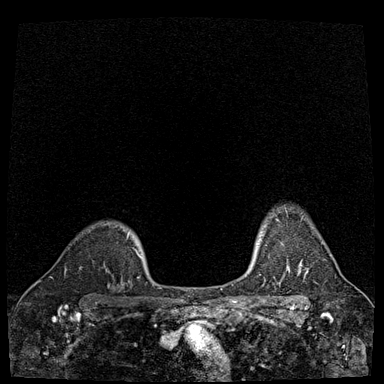
[im 160/160]
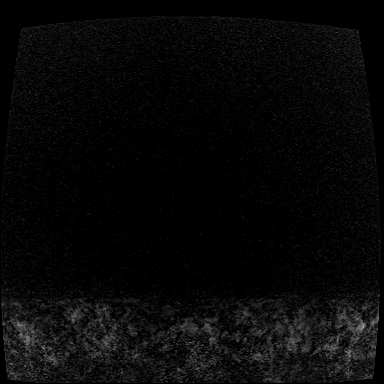

[Series 7: fl3d post-cm 20 · axial · 1.2mm · 0.83mm/px · z∈[-74,+117]mm · 5 of 160 slices shown (2 of 3)]
[im 1/160]
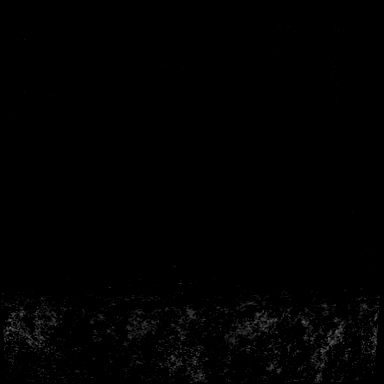
[im 40/160]
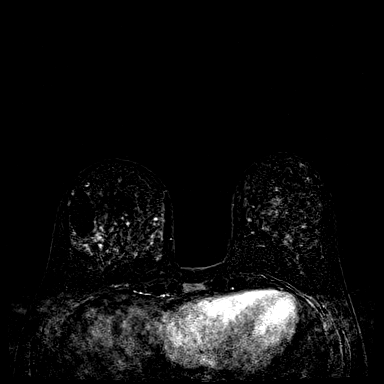
[im 80/160]
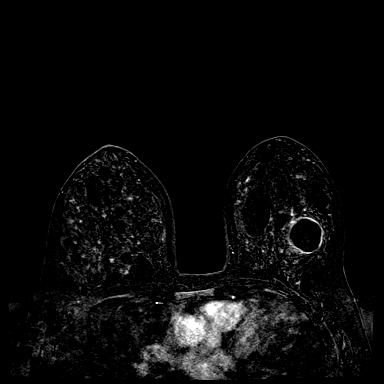
[im 120/160]
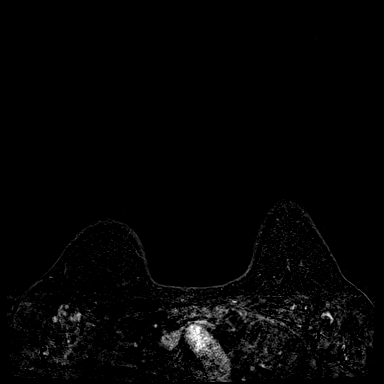
[im 160/160]
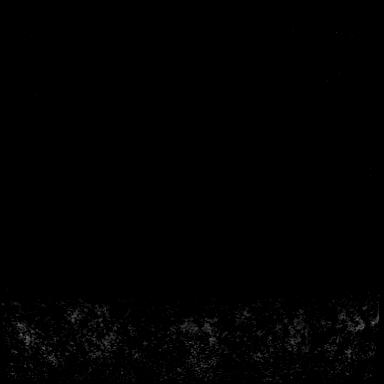

[Series 8: fl3d post-cm 20 · axial · 192.0mm · 0.83mm/px · 1 of 1 slices shown (3 of 3)]
[im 1/1]
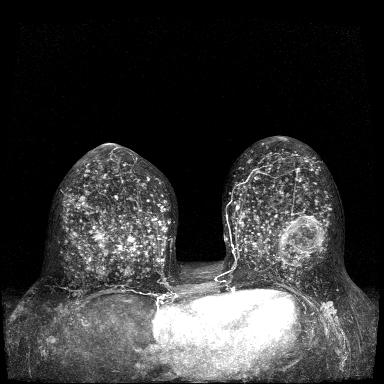

[Series 9: fl3d post-cm 3min · axial · 1.2mm · 0.83mm/px · z∈[-74,+117]mm · 6 of 160 slices shown]
[im 1/160]
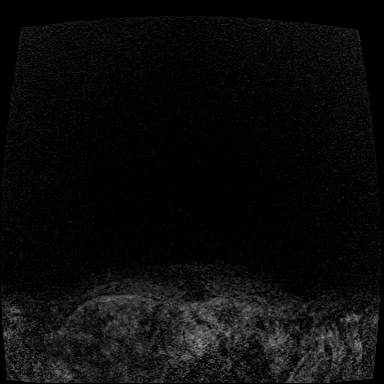
[im 32/160]
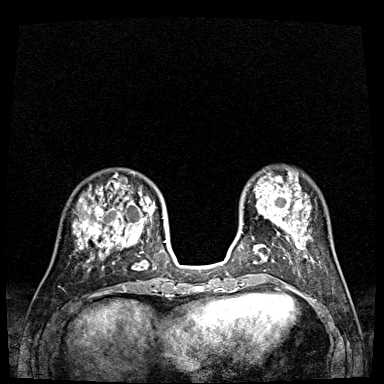
[im 64/160]
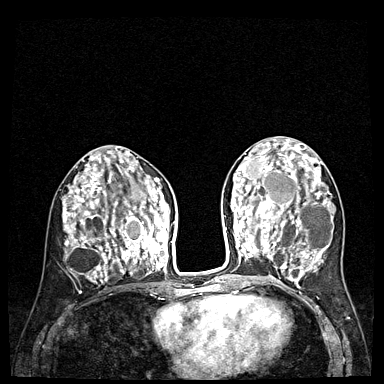
[im 96/160]
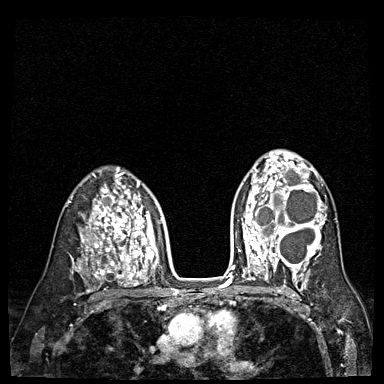
[im 128/160]
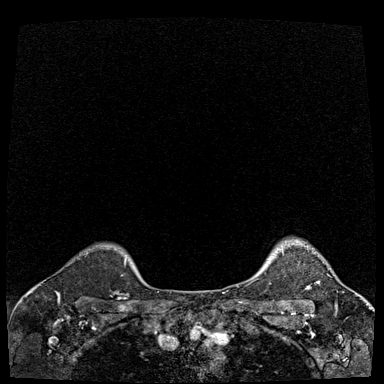
[im 160/160]
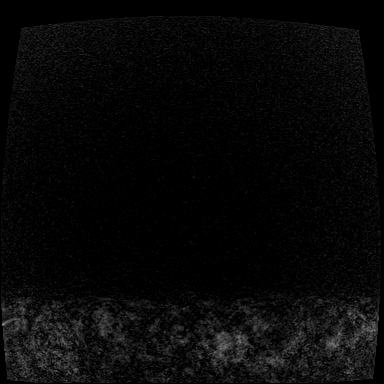

[Series 10: fl3d post-cm 3min_sub · axial · 1.2mm · 0.83mm/px · z∈[-74,+40]mm · 4 of 160 slices shown]
[im 1/160]
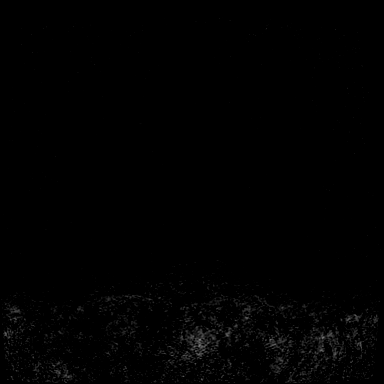
[im 32/160]
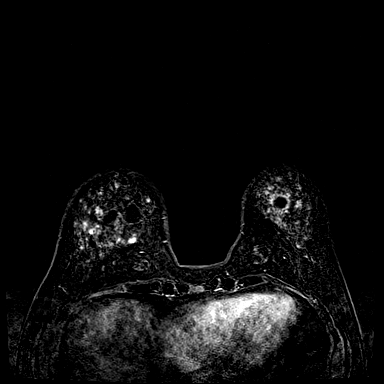
[im 64/160]
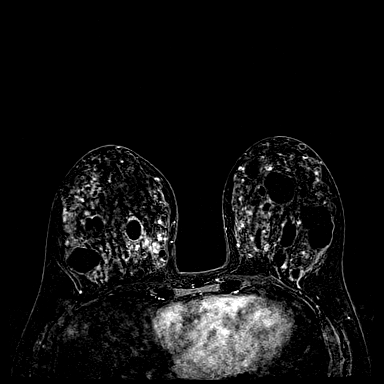
[im 96/160]
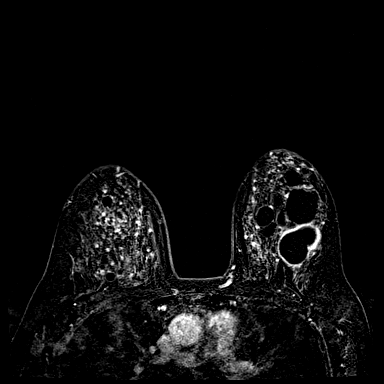

[32 of 48 positions shown; findings below may reference images not displayed]

FINDINGS: Breast composition: d. Extreme fibroglandular tissue.

Background parenchymal enhancement: Marked.

Right breast: Multiple T2 bright circumscribed oval masses scattered
throughout all 4 quadrants, consistent with benign cysts. Multiple
enhancing foci throughout all 4 quadrants, symmetric to the left
breast. No dominant mass or suspicious enhancement above the
background parenchymal enhancement pattern.

Left breast: Multiple T2 bright circumscribed oval masses scattered
throughout all 4 quadrants, consistent with benign cysts. Multiple
enhancing foci throughout all 4 quadrants, symmetric to the right
breast. No dominant mass or suspicious enhancement above the
background parenchymal enhancement pattern.

Lymph nodes: No abnormal appearing lymph nodes.

Ancillary findings:  None.
IMPRESSION: No MRI specific evidence of malignancy in either breast. Multiple
bilateral cysts. Marked background parenchymal enhancement, which
can decrease the sensitivity for detecting malignancy.

Consider clinical evaluation of the patient's reported bilateral
brown nipple discharge. At the time of the patient's recent
diagnostic evaluation June 07, 2018, this patient concern was not
described in the report, and may not have been mentioned to the
technologist/physician. I am unaware if this is spontaneous or
nonspontaneous discharge.

RECOMMENDATION:
Bilateral screening mammogram is recommended in May 2019.
Consider bilateral screening breast MRI in 1 year.

BI-RADS CATEGORY  2: Benign.

## 2020-07-04 ENCOUNTER — Other Ambulatory Visit: Payer: Self-pay | Admitting: Psychiatry

## 2020-07-04 DIAGNOSIS — F4001 Agoraphobia with panic disorder: Secondary | ICD-10-CM

## 2020-07-04 DIAGNOSIS — F411 Generalized anxiety disorder: Secondary | ICD-10-CM

## 2020-07-08 ENCOUNTER — Other Ambulatory Visit: Payer: Self-pay | Admitting: Psychiatry

## 2020-07-08 DIAGNOSIS — F4001 Agoraphobia with panic disorder: Secondary | ICD-10-CM

## 2020-07-08 DIAGNOSIS — F411 Generalized anxiety disorder: Secondary | ICD-10-CM

## 2020-07-08 NOTE — Telephone Encounter (Signed)
Apt 09/30

## 2020-07-09 NOTE — Telephone Encounter (Signed)
Pended until apt tomorrow 09/30

## 2020-07-10 ENCOUNTER — Encounter: Payer: Self-pay | Admitting: Psychiatry

## 2020-07-10 ENCOUNTER — Ambulatory Visit (INDEPENDENT_AMBULATORY_CARE_PROVIDER_SITE_OTHER): Payer: BC Managed Care – PPO | Admitting: Psychiatry

## 2020-07-10 ENCOUNTER — Other Ambulatory Visit: Payer: Self-pay

## 2020-07-10 DIAGNOSIS — F411 Generalized anxiety disorder: Secondary | ICD-10-CM

## 2020-07-10 DIAGNOSIS — F4001 Agoraphobia with panic disorder: Secondary | ICD-10-CM

## 2020-07-10 DIAGNOSIS — G43009 Migraine without aura, not intractable, without status migrainosus: Secondary | ICD-10-CM

## 2020-07-10 MED ORDER — SUMATRIPTAN SUCCINATE 100 MG PO TABS: ORAL_TABLET | ORAL | 8 refills | Status: DC

## 2020-07-10 MED ORDER — ESCITALOPRAM OXALATE 20 MG PO TABS: 30.0000 mg | ORAL_TABLET | Freq: Every day | ORAL | 1 refills | Status: DC

## 2020-07-10 NOTE — Progress Notes (Signed)
Rachel Ryan [id number] 1975-11-23 44 y.o.  Subjective:   Patient ID:  Rachel Ryan is a 44 y.o. (DOB [birth year]) female.  Chief Complaint:  Chief Complaint  Patient presents with  . Follow-up  . Anxiety    Anxiety Symptoms include nervous/anxious behavior. Patient reports no confusion, decreased concentration, palpitations, shortness of breath or suicidal ideas.     Carlyon Shadow Rosenkranz presents to the office today for follow-up of anxiety and migraine.  When seen seen in October.  She was under a lot of stress and asking for more Xanax.  Since then she got a 90-day supply of Xanax and it appears she exceeded the dosage recommended taking up to 6 daily.  She then sought an early refill.  She denies this.  She denies exceeding 3 daily average.  She says that [facility] is refilling the wrong dose of 180 ct when it should be #270/90 days for 3 daily.  Last visit September 2020.  She was doing relatively well emotionally but was concerned about her weight.  Therefore we made the following decision: No med changes today except OK gradual reduction in Lexapro towards a target of 20 mg daily to see if weight gain will be improved.    12/22/19 appt noted: Went down to 20 mg Lexapro for a month but very irritable and angry so increased it back to 30 mg and it's better but not gone.  No energy, don't want to do anything and is irritable.  Situational depression.  Main stress H staying at home all the time.  Daycare and school out a lot so everyone all together all the time and hard on her if doesn't get alone time.  H works from home. Adopted D just started back in person pre-K.  Worries lockdown hurting the kids and her marriage. Average worse up to 14-15  HA monthly last couple of months. Adoption finally done after 3 years.  H been home working since March, not good.  Gets on her nerves. Plan: option Abilify potentiation  07/10/20 appt noted: CO difficulty getting Xanax  refill on time.   Overall about the same with no energy and no desire to do anything.  Stress H working from home but not really working very much.  She wants him to go back to work.   Stress with lockdown.  Never alone now. Not depressed.   HA frequency is about the same.    Pt reports that mood is Anxious and describes anxiety as Moderate. Anxiety symptoms include: Excessive Worry,. Pt reports no sleep issues and Unless H interferes with snoring. Pt reports that appetite is good. Pt reports that energy is poor and no change. Concentration is down slightly. Suicidal thoughts:  denied by patient.  Imitrex makes HA stop.  Frequency mainly menstrual total 6-7 monthly.  Past Psychiatric Medication Trials:  Doxazosin,  fluoxetine, sertraline colitis, lexapro 30,  buspirone  Abilify 5 for couple mos insomnia, helped energy but wt gain  Review of Systems:  Review of Systems  Respiratory: Negative for shortness of breath.   Cardiovascular: Negative for palpitations.  Neurological: Positive for headaches. Negative for tremors and weakness.  Psychiatric/Behavioral: Negative for agitation, behavioral problems, confusion, decreased concentration, dysphoric mood, hallucinations, self-injury, sleep disturbance and suicidal ideas. The patient is nervous/anxious. The patient is not hyperactive.     Medications: I have reviewed the patient's current medications.  Current Outpatient Medications  Medication Sig Dispense Refill  . Aspirin-Acetaminophen-Caffeine (EXCEDRIN PO) Take 2 tablets by  mouth every 8 (eight) hours as needed. headache     . diphenhydrAMINE (BENADRYL) 25 mg capsule Take 25 mg by mouth at bedtime as needed. sleep     . escitalopram (LEXAPRO) 20 MG tablet Take 1.5 tablets (30 mg total) by mouth daily. 135 tablet 1  . Multiple Vitamin (MULTIVITAMIN) tablet Take 1 tablet by mouth daily.      . SUMAtriptan (IMITREX) 100 MG tablet TAKE 1 TABLET EVERY 2 HOURS AS NEEDED FOR MIGRAINE. MAY  REPEAT IN 2 HOURS IF HEADACHE PERSISTS OR RECURS 27 tablet 8  . ALPRAZolam (XANAX) 1 MG tablet TAKE 1/2 TO 1 TABLET BY MOUTH EVERY 6 HOURS AS NEEDED FOR ANXIETY. NOT TO EXCEED 3 DAILY 270 tablet 1   No current facility-administered medications for this visit.    Medication Side Effects: None ?sweating  Allergies:  Allergies  Allergen Reactions  . Levofloxacin     Severe headache  . Sulfa Antibiotics     dizziness    Past Medical History:  Diagnosis Date  . Anxiety   . Breast mass    2 lumps lt breast unknown time   . Depressed   . Family history of breast cancer   . Family history of colon cancer   . 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 Migraine     Family History  Problem Relation Age of Onset  . Colon polyps Mother        cancerous  . Colon cancer Mother 55  . Breast cancer Paternal Aunt 35  . Breast cancer Paternal Grandfather 66  . Breast cancer Paternal Aunt 57       Genetic testing - MUTYH mutation  . Melanoma Maternal Uncle        dx in his 60s  . Breast cancer Other        MGMs sister dx in her 83s-30s    Social History   Socioeconomic History  . Marital status: Married    Spouse name: Not on file  . Number of children: 1  . Years of education: Not on file  . Highest education level: Not on file  Occupational History  . Occupation: unemployed  Tobacco Use  . Smoking status: Never Smoker  . Smokeless tobacco: Never Used  Substance and Sexual Activity  . Alcohol use: No  . Drug use: No  . Sexual activity: Yes    Comment: lmp 09/26/13  Other Topics Concern  . Not on file  Social History Narrative  . Not on file   Social Determinants of Health   Financial Resource Strain:   . Difficulty of Paying Living Expenses: Not on file  Food Insecurity:   . Worried About Charity fundraiser in the Last Year: Not on file  . Ran Out of Food in the Last Year: Not on file  Transportation Needs:    . Lack of Transportation (Medical): Not on file  . Lack of Transportation (Non-Medical): Not on file  Physical Activity:   . Days of Exercise per Week: Not on file  . Minutes of Exercise per Session: Not on file  Stress:   . Feeling of Stress : Not on file  Social Connections:   . Frequency of Communication with Friends and Family: Not on file  . Frequency of Social Gatherings with Friends and Family: Not on file  . Attends Religious Services: Not on file  . Active Member of Clubs or Organizations: Not on  file  . Attends Archivist Meetings: Not on file  . Marital Status: Not on file  Intimate Partner Violence:   . Fear of Current or Ex-Partner: Not on file  . Emotionally Abused: Not on file  . Physically Abused: Not on file  . Sexually Abused: Not on file    Past Medical History, Surgical history, Social history, and Family history were reviewed and updated as appropriate.   Please see review of systems for further details on the patient's review from today.   Objective:   Physical Exam:  There were no vitals taken for this visit.  Physical Exam Constitutional:      General: She is not in acute distress.    Appearance: She is well-developed.  Musculoskeletal:        General: No deformity.  Neurological:     Mental Status: She is alert and oriented to person, place, and time.     Coordination: Coordination normal.  Psychiatric:        Attention and Perception: Attention and perception normal. She does not perceive auditory or visual hallucinations.        Mood and Affect: Mood is anxious. Mood is not depressed. Affect is not labile, blunt, angry or inappropriate.        Speech: Speech normal.        Behavior: Behavior normal.        Thought Content: Thought content normal. Thought content does not include homicidal or suicidal ideation. Thought content does not include homicidal or suicidal plan.        Cognition and Memory: Cognition and memory normal.         Judgment: Judgment normal.     Comments: Insight intact. No delusions.  Irritable.     Lab Review:     Component Value Date/Time   NA 141 03/02/2014 0120   K 3.8 03/02/2014 0120   CL 103 03/02/2014 0120   CO2 22 03/02/2014 0120   GLUCOSE 110 (H) 03/02/2014 0120   BUN 14 03/02/2014 0120   CREATININE 0.70 03/02/2014 0120   CALCIUM 9.4 03/02/2014 0120   GFRNONAA >90 03/02/2014 0120   GFRAA >90 03/02/2014 0120       Component Value Date/Time   WBC 10.7 (H) 03/02/2014 0120   RBC 3.58 (L) 03/02/2014 0120   HGB 12.1 03/02/2014 0120   HCT 35.5 (L) 03/02/2014 0120   PLT 257 03/02/2014 0120   MCV 99.2 03/02/2014 0120   MCH 33.8 03/02/2014 0120   MCHC 34.1 03/02/2014 0120   RDW 12.2 03/02/2014 0120   LYMPHSABS 1.0 03/02/2014 0120   MONOABS 0.6 03/02/2014 0120   EOSABS 0.0 03/02/2014 0120   BASOSABS 0.0 03/02/2014 0120    No results found for: POCLITH, LITHIUM   No results found for: PHENYTOIN, PHENOBARB, VALPROATE, CBMZ   .res Assessment: Plan:    Generalized anxiety disorder - Plan: escitalopram (LEXAPRO) 20 MG tablet  Panic disorder with agoraphobia - Plan: escitalopram (LEXAPRO) 20 MG tablet  Migraine without aura and without status migrainosus, not intractable - Plan: SUMAtriptan (IMITREX) 100 MG tablet   Greater than 50% of 30-minute phone to phone time with patient was spent on counseling and coordination of care.  She does still have some panic attacks sometimes are triggered by migraine headaches and sometimes they are just generally stress related but they are not full panic they are typically limited symptom panic attacks.  She does take the Xanax. Overall she is more irritable and stressed related  to Covid with some degree of depression as noted.  Option Rexulti bc benefit of Abilify but SE with it.  She asks about increasing Xanax.  We would prefer to avoid that.  She appears to have some degree of tolerance developing and this was discussed. Continue  xanax 1 mg 1 tablet q 8 hours prn anxiety.  She agrees to the limit of 3 a day. She is taking 3 mg daily.  She denies SE.   She denies abusing any alprazolam.  We discussed the short-term risks associated with benzodiazepines including sedation and increased fall risk among others.  Discussed long-term side effect risk including dependence, potential withdrawal symptoms, and the potential eventual dose-related risk of dementia. Newer studies refute that.   She does not want to change from Lexapro right now.    Option switch to Effexor for help with less weight gain.  Disc other alternatives like Trintellix but is less reliably effective for anxiety though it does have less weight gain risk.  Discussed side effects of each of these options in detail.   Disc SE in detail and SSRI withdrawal sx.   Disc with HA frequency she consider Aimovig, Ajovy etc. she is having more headaches but it is likely stress related.  Discussed these medications in detail.  She would likely need to meet criteria by failing a couple of other preventatives such as topiramate etc. before she would qualify.  We discussed other headache preventatives including venlafaxine.  She will consider these.  We are supplying the Imitrex as needed for headaches and they appear reasonably managed.  FU 6 mo  Lynder Parents, MD, DFAPA   Please see After Visit Summary for patient specific instructions.  No future appointments.  No orders of the defined types were placed in this encounter.     -------------------------------

## 2020-09-18 ENCOUNTER — Other Ambulatory Visit: Payer: Self-pay | Admitting: Obstetrics and Gynecology

## 2020-09-18 DIAGNOSIS — Z9189 Other specified personal risk factors, not elsewhere classified: Secondary | ICD-10-CM

## 2020-09-19 ENCOUNTER — Other Ambulatory Visit: Payer: Self-pay | Admitting: Obstetrics and Gynecology

## 2020-09-19 DIAGNOSIS — E041 Nontoxic single thyroid nodule: Secondary | ICD-10-CM

## 2020-10-14 ENCOUNTER — Ambulatory Visit
Admission: RE | Admit: 2020-10-14 | Discharge: 2020-10-14 | Disposition: A | Discharge status: Home / Self Care | Payer: BC Managed Care – PPO | Source: Ambulatory Visit | Attending: Obstetrics and Gynecology | Admitting: Obstetrics and Gynecology

## 2020-10-14 DIAGNOSIS — E041 Nontoxic single thyroid nodule: Secondary | ICD-10-CM

## 2020-10-16 ENCOUNTER — Other Ambulatory Visit: Payer: BC Managed Care – PPO

## 2020-11-04 ENCOUNTER — Other Ambulatory Visit: Payer: BC Managed Care – PPO

## 2020-11-11 ENCOUNTER — Other Ambulatory Visit: Payer: Self-pay | Admitting: Obstetrics and Gynecology

## 2020-11-11 DIAGNOSIS — N6310 Unspecified lump in the right breast, unspecified quadrant: Secondary | ICD-10-CM

## 2020-11-11 DIAGNOSIS — N6320 Unspecified lump in the left breast, unspecified quadrant: Secondary | ICD-10-CM

## 2020-12-30 ENCOUNTER — Other Ambulatory Visit: Payer: Self-pay | Admitting: Psychiatry

## 2020-12-30 DIAGNOSIS — F4001 Agoraphobia with panic disorder: Secondary | ICD-10-CM

## 2020-12-30 DIAGNOSIS — F411 Generalized anxiety disorder: Secondary | ICD-10-CM

## 2020-12-30 NOTE — Telephone Encounter (Signed)
Controlled substance 

## 2021-01-06 ENCOUNTER — Other Ambulatory Visit: Payer: Self-pay | Admitting: Psychiatry

## 2021-01-06 ENCOUNTER — Ambulatory Visit: Payer: BC Managed Care – PPO | Admitting: Psychiatry

## 2021-01-06 DIAGNOSIS — F4001 Agoraphobia with panic disorder: Secondary | ICD-10-CM

## 2021-01-06 DIAGNOSIS — F411 Generalized anxiety disorder: Secondary | ICD-10-CM

## 2021-01-06 NOTE — Telephone Encounter (Signed)
review 

## 2021-01-15 ENCOUNTER — Other Ambulatory Visit: Payer: Self-pay

## 2021-02-05 ENCOUNTER — Telehealth: Payer: Self-pay | Admitting: Internal Medicine

## 2021-02-05 NOTE — Telephone Encounter (Signed)
Dr. Carlean Purl will this change her colon recall date?

## 2021-02-05 NOTE — Telephone Encounter (Signed)
Patient is wondering if her recall should still be within 10 yrs of her last colonoscopy since her mother has hx of polyps at the age of 45 yrs old. Please advise

## 2021-02-10 NOTE — Telephone Encounter (Signed)
Patient reports that her mother did have colon cancer at age 45.  She is advised she would be due now.  She wants to wait until there is a morning opening.  She wants to call back in a week or so to schedule.

## 2021-02-10 NOTE — Telephone Encounter (Signed)
Noted and agree. 

## 2021-02-10 NOTE — Telephone Encounter (Signed)
I reviewed her family history that is in the chart.  It says her mother had colon cancer at 67 and also polyps that were "cancerous".  I am wondering if this is accurate.  Certainly if her mother had colon cancer she needs a colonoscopy now.    I think we need to sort out the family history of a bit better before I give a final recommendation.  See what we can find out please.

## 2021-02-20 ENCOUNTER — Ambulatory Visit: Payer: Self-pay

## 2021-02-20 ENCOUNTER — Ambulatory Visit
Admission: RE | Admit: 2021-02-20 | Discharge: 2021-02-20 | Disposition: A | Discharge status: Home / Self Care | Payer: Self-pay | Source: Ambulatory Visit | Attending: Obstetrics and Gynecology | Admitting: Obstetrics and Gynecology

## 2021-02-20 ENCOUNTER — Other Ambulatory Visit: Payer: Self-pay

## 2021-02-20 DIAGNOSIS — R922 Inconclusive mammogram: Secondary | ICD-10-CM | POA: Diagnosis not present

## 2021-02-20 DIAGNOSIS — Z803 Family history of malignant neoplasm of breast: Secondary | ICD-10-CM | POA: Diagnosis not present

## 2021-02-20 DIAGNOSIS — N6320 Unspecified lump in the left breast, unspecified quadrant: Secondary | ICD-10-CM

## 2021-02-26 ENCOUNTER — Other Ambulatory Visit: Payer: BC Managed Care – PPO

## 2021-03-06 ENCOUNTER — Other Ambulatory Visit: Payer: Self-pay

## 2021-03-06 ENCOUNTER — Ambulatory Visit
Admission: RE | Admit: 2021-03-06 | Discharge: 2021-03-06 | Disposition: A | Discharge status: Home / Self Care | Payer: Self-pay | Source: Ambulatory Visit | Attending: Obstetrics and Gynecology | Admitting: Obstetrics and Gynecology

## 2021-03-06 DIAGNOSIS — Z9189 Other specified personal risk factors, not elsewhere classified: Secondary | ICD-10-CM

## 2021-03-06 DIAGNOSIS — N6009 Solitary cyst of unspecified breast: Secondary | ICD-10-CM | POA: Diagnosis not present

## 2021-03-06 MED ORDER — GADOBUTROL 1 MMOL/ML IV SOLN
7.0000 mL | Freq: Once | INTRAVENOUS | Status: AC | PRN
  Administered 2021-03-06: 7 mL via INTRAVENOUS

## 2021-03-09 ENCOUNTER — Other Ambulatory Visit: Payer: Self-pay | Admitting: Psychiatry

## 2021-03-09 DIAGNOSIS — F411 Generalized anxiety disorder: Secondary | ICD-10-CM

## 2021-03-09 DIAGNOSIS — F4001 Agoraphobia with panic disorder: Secondary | ICD-10-CM

## 2021-03-27 ENCOUNTER — Other Ambulatory Visit: Payer: Self-pay | Admitting: Psychiatry

## 2021-03-27 DIAGNOSIS — F4001 Agoraphobia with panic disorder: Secondary | ICD-10-CM

## 2021-03-27 DIAGNOSIS — F411 Generalized anxiety disorder: Secondary | ICD-10-CM

## 2021-04-06 ENCOUNTER — Other Ambulatory Visit: Payer: Self-pay | Admitting: Psychiatry

## 2021-04-06 ENCOUNTER — Telehealth: Payer: Self-pay | Admitting: Psychiatry

## 2021-04-06 DIAGNOSIS — F4001 Agoraphobia with panic disorder: Secondary | ICD-10-CM

## 2021-04-06 DIAGNOSIS — F411 Generalized anxiety disorder: Secondary | ICD-10-CM

## 2021-04-06 MED ORDER — ESCITALOPRAM OXALATE 5 MG PO TABS: 5.0000 mg | ORAL_TABLET | Freq: Every day | ORAL | 1 refills | Status: DC

## 2021-04-06 NOTE — Telephone Encounter (Signed)
Pt stated she does not remember when you increased dose but she has gained weight since then.She said she has tried to decrease the dose herself but it makes her very irritated.She wants to know what can be done to help the weight gain.

## 2021-04-06 NOTE — Telephone Encounter (Signed)
Outside of an appointment [place] suggestion is to reduce the Lexapro more gradually.  To reduce from 30 mg to 25 mg for a month and then go to 20 mg.  She will need some 5 mg tablets in order to do that.  I'll send in a RX.   At her appointment we can talk about switching to venlafaxine but that is not an easy switch so we will need an appointment to do that.

## 2021-04-06 NOTE — Telephone Encounter (Signed)
Please add to cancellation list

## 2021-04-06 NOTE — Telephone Encounter (Signed)
Next visit is 05/27/21. Rachel Ryan states she has gained 25 lbs since being on the Lexapro 30 mg. She states she has been on the Lexapro 30 mg for two years now. Please call her at [phone] to discuss adjusting the dosage to prevent her weight gain.

## 2021-05-06 ENCOUNTER — Other Ambulatory Visit: Payer: Self-pay | Admitting: Psychiatry

## 2021-05-06 DIAGNOSIS — F4001 Agoraphobia with panic disorder: Secondary | ICD-10-CM

## 2021-05-06 DIAGNOSIS — F411 Generalized anxiety disorder: Secondary | ICD-10-CM

## 2021-05-27 ENCOUNTER — Ambulatory Visit (INDEPENDENT_AMBULATORY_CARE_PROVIDER_SITE_OTHER): Payer: BC Managed Care – PPO | Admitting: Psychiatry

## 2021-05-27 ENCOUNTER — Encounter: Payer: Self-pay | Admitting: Psychiatry

## 2021-05-27 ENCOUNTER — Other Ambulatory Visit: Payer: Self-pay

## 2021-05-27 DIAGNOSIS — F4001 Agoraphobia with panic disorder: Secondary | ICD-10-CM | POA: Diagnosis not present

## 2021-05-27 DIAGNOSIS — G43009 Migraine without aura, not intractable, without status migrainosus: Secondary | ICD-10-CM

## 2021-05-27 DIAGNOSIS — F411 Generalized anxiety disorder: Secondary | ICD-10-CM

## 2021-05-27 MED ORDER — FLUOXETINE HCL 20 MG PO CAPS: 60.0000 mg | ORAL_CAPSULE | Freq: Every day | ORAL | 0 refills | Status: DC

## 2021-05-27 MED ORDER — ALPRAZOLAM 1 MG PO TABS: ORAL_TABLET | ORAL | 0 refills | Status: DC

## 2021-05-27 NOTE — Progress Notes (Signed)
Rachel Ryan [id number] Feb 20, 1976 45 y.o.  Subjective:   Patient ID:  Rachel Ryan is a 45 y.o. (DOB 1976-01-09) female.  Chief Complaint:  Chief Complaint  Patient presents with   Follow-up   Anxiety   Medication Problem    Anxiety Symptoms include nervous/anxious behavior. Patient reports no confusion, decreased concentration, palpitations, shortness of breath or suicidal ideas.    Rachel Ryan presents to the office today for follow-up of anxiety and migraine.  When seen seen in October.  She was under a lot of stress and asking for more Xanax.  Since then she got a 90-day supply of Xanax and it appears she exceeded the dosage recommended taking up to 6 daily.  She then sought an early refill.  She denies this.  She denies exceeding 3 daily average.  She says that [facility] is refilling the wrong dose of 180 ct when it should be #270/90 days for 3 daily.  Last visit September 2020.  She was doing relatively well emotionally but was concerned about her weight.  Therefore we made the following decision: No med changes today except OK gradual reduction in Lexapro towards a target of 20 mg daily to see if weight gain will be improved.    12/22/19 appt noted: Went down to 20 mg Lexapro for a month but very irritable and angry so increased it back to 30 mg and it's better but not gone.  No energy, don't want to do anything and is irritable.  Situational depression.  Main stress H staying at home all the time.  Daycare and school out a lot so everyone all together all the time and hard on her if doesn't get alone time.  H works from home. Adopted D just started back in person pre-K.  Worries lockdown hurting the kids and her marriage. Average worse up to 14-15  HA monthly last couple of months. Adoption finally done after 3 years.  H been home working since March, not good.  Gets on her nerves. Plan: option Abilify potentiation  07/10/20 appt noted: CO  difficulty getting Xanax refill on time.   Overall about the same with no energy and no desire to do anything.  Stress H working from home but not really working very much.  She wants him to go back to work.   Stress with lockdown.  Never alone now. Not depressed.   HA frequency is about the same.   Pt reports that mood is Anxious and describes anxiety as Moderate. Anxiety symptoms include: Excessive Worry,. Pt reports no sleep issues and Unless H interferes with snoring. Pt reports that appetite is good. Pt reports that energy is poor and no change. Concentration is down slightly. Suicidal thoughts:  denied by patient. Plan: No med changes  04/06/2021 phone call complaining of weight gain apparently from Lexapro.  She was given instructions to gradually reduce from 30 mg to 20 mg and was encouraged to make an earlier appointment so that we could change perhaps to venlafaxine.  05/27/2021 appointment with the following noted: Tried lower dose Lexapro to 20 mg but too irritable and too anxious to manage.  Gained 25# since on Lexapro and didn't lose weight while on 20 mg Lexapro.   Adopted D. And has grown kids. A little panic and anxiety. Propranolol reduced HA from 15/mo to 8/mo.  Imitrex makes HA stop.  Frequency mainly menstrual total 6-7 monthly.  Past Psychiatric Medication Trials:  Doxazosin,  Fluoxetine partial response, sertraline  colitis, lexapro 30 SE wt gain,  buspirone  Abilify 5 for couple mos insomnia, helped energy but wt gain  Review of Systems:  Review of Systems  Constitutional:  Negative for fatigue.  Respiratory:  Negative for shortness of breath.   Cardiovascular:  Negative for palpitations.  Neurological:  Positive for headaches. Negative for tremors and weakness.  Psychiatric/Behavioral:  Negative for agitation, behavioral problems, confusion, decreased concentration, dysphoric mood, hallucinations, self-injury, sleep disturbance and suicidal ideas. The patient is  nervous/anxious. The patient is not hyperactive.    Medications: I have reviewed the patient's current medications.  Current Outpatient Medications  Medication Sig Dispense Refill   Aspirin-Acetaminophen-Caffeine (EXCEDRIN PO) Take 2 tablets by mouth every 8 (eight) hours as needed. headache      diphenhydrAMINE (BENADRYL) 25 mg capsule Take 25 mg by mouth at bedtime as needed. sleep      escitalopram (LEXAPRO) 5 MG tablet Take 1 tablet (5 mg total) by mouth daily. (Patient taking differently: Take 30 mg by mouth daily.) 30 tablet 1   FLUoxetine (PROZAC) 20 MG capsule Take 3 capsules (60 mg total) by mouth daily. 270 capsule 0   Multiple Vitamin (MULTIVITAMIN) tablet Take 1 tablet by mouth daily.       propranolol (INDERAL) 20 MG tablet Take 20 mg by mouth 3 (three) times daily.     SUMAtriptan (IMITREX) 100 MG tablet TAKE 1 TABLET EVERY 2 HOURS AS NEEDED FOR MIGRAINE. MAY REPEAT IN 2 HOURS IF HEADACHE PERSISTS OR RECURS 27 tablet 8   ALPRAZolam (XANAX) 1 MG tablet TAKE 1/2 TO 1 TABLET BY MOUTH EVERY 6 HOURS AS NEEDED FOR ANXIETY. NOT TO EXCEED 3 DAILY 270 tablet 0   No current facility-administered medications for this visit.    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   Colonoscopy 10/2013 Entocort EC 9 mg daily started   Migraine     Family History  Problem Relation Age of Onset   Colon polyps Mother        cancerous   Colon cancer Mother 65   Breast cancer Paternal Aunt 16   Breast cancer Paternal Grandfather 75   Breast cancer Paternal Aunt 7       Genetic testing - MUTYH mutation   Melanoma Maternal Uncle        dx in his 5s    Breast cancer Other        MGMs sister dx in her 68s-30s    Social History   Socioeconomic History   Marital status: Married    Spouse name: Not on file   Number of children: 1   Years of education: Not on file   Highest education level: Not on file  Occupational History   Occupation: unemployed  Tobacco Use   Smoking status: Never   Smokeless tobacco: Never  Substance and Sexual Activity   Alcohol use: No   Drug use: No   Sexual activity: Yes    Comment: lmp 09/26/13  Other Topics Concern   Not on file  Social History Narrative  Not on file   Social Determinants of Health   Financial Resource Strain: Not on file  Food Insecurity: Not on file  Transportation Needs: Not on file  Physical Activity: Not on file  Stress: Not on file  Social Connections: Not on file  Intimate Partner Violence: Not on file    Past Medical History, Surgical history, Social history, and Family history were reviewed and updated as appropriate.   Please see review of systems for further details on the patient's review from today.   Objective:   Physical Exam:  There were no vitals taken for this visit.  Physical Exam Constitutional:      General: She is not in acute distress.    Appearance: She is well-developed.  Musculoskeletal:        General: No deformity.  Neurological:     Mental Status: She is alert and oriented to person, place, and time.     Coordination: Coordination normal.  Psychiatric:        Attention and Perception: Attention and perception normal. She does not perceive auditory or visual hallucinations.        Mood and Affect: Mood is anxious. Mood is not depressed. Affect is not labile, blunt, angry or inappropriate.        Speech: Speech normal. Speech is not slurred.        Behavior: Behavior normal.        Thought Content: Thought content normal. Thought content does not include homicidal or suicidal ideation. Thought content does not include homicidal or  suicidal plan.        Cognition and Memory: Cognition and memory normal.        Judgment: Judgment normal.     Comments: Insight intact. No delusions.  Mild irritable.    Lab Review:     Component Value Date/Time   NA 141 03/02/2014 0120   K 3.8 03/02/2014 0120   CL 103 03/02/2014 0120   CO2 22 03/02/2014 0120   GLUCOSE 110 (H) 03/02/2014 0120   BUN 14 03/02/2014 0120   CREATININE 0.70 03/02/2014 0120   CALCIUM 9.4 03/02/2014 0120   GFRNONAA >90 03/02/2014 0120   GFRAA >90 03/02/2014 0120       Component Value Date/Time   WBC 10.7 (H) 03/02/2014 0120   RBC 3.58 (L) 03/02/2014 0120   HGB 12.1 03/02/2014 0120   HCT 35.5 (L) 03/02/2014 0120   PLT 257 03/02/2014 0120   MCV 99.2 03/02/2014 0120   MCH 33.8 03/02/2014 0120   MCHC 34.1 03/02/2014 0120   RDW 12.2 03/02/2014 0120   LYMPHSABS 1.0 03/02/2014 0120   MONOABS 0.6 03/02/2014 0120   EOSABS 0.0 03/02/2014 0120   BASOSABS 0.0 03/02/2014 0120    No results found for: POCLITH, LITHIUM   No results found for: PHENYTOIN, PHENOBARB, VALPROATE, CBMZ   .res Assessment: Plan:    Generalized anxiety disorder - Plan: FLUoxetine (PROZAC) 20 MG capsule, ALPRAZolam (XANAX) 1 MG tablet  Panic disorder with agoraphobia - Plan: FLUoxetine (PROZAC) 20 MG capsule, ALPRAZolam (XANAX) 1 MG tablet  Migraine without aura and without status migrainosus, not intractable   Greater than 50% of 30-minute phone to phone time with patient was spent on counseling and coordination of care.  She does still have some panic attacks sometimes are triggered by migraine headaches and sometimes they are just generally stress related but they are not full panic they are typically limited symptom panic attacks.  She does take the Xanax.  Option  Rexulti bc benefit of Abilify but SE with it.  She asks about increasing Xanax.  We would prefer to avoid that.  She appears to have some degree of tolerance developing and this was discussed. Continue xanax 1  mg 1 tablet q 8 hours prn anxiety.  She agrees to the limit of 3 a day. She is taking 3 mg daily.  She denies SE.   She denies abusing any alprazolam.  We discussed the short-term risks associated with benzodiazepines including sedation and increased fall risk among others.  Discussed long-term side effect risk including dependence, potential withdrawal symptoms, and the potential eventual dose-related risk of dementia. Newer studies refute that.   Option switch to Effexor for help with less weight gain.  She prefers return to Prozac bc she knows it didn't make her hungry like the Lexapro.  Disc other alternatives like Trintellix but is less reliably effective for anxiety though it does have less weight gain risk.  Discussed side effects of each of these options in detail.   Disc SE in detail and SSRI withdrawal sx.  Reduce Lexapro to 20 mg daily and start fluoxetine 20 mg daily for 1 week, Then increase fluoxetine to 2 of the 20 mg capsules and reduce Lexapro to 10 mg daily for 1 week. Then increase fluoxetine to 3 capsules daily and stop Lexapro  Disc with HA frequency she consider Aimovig, Ajovy etc. she is having more headaches but it is likely stress related.  Discussed these medications in detail.  She would likely need to meet criteria by failing a couple of other preventatives such as topiramate etc. before she would qualify.  We discussed other headache preventatives including venlafaxine.  She will consider these.  We are supplying the Imitrex as needed for headaches and they appear reasonably managed.  FU 3 mo  Lynder Parents, MD, DFAPA   Please see After Visit Summary for patient specific instructions.  No future appointments.  No orders of the defined types were placed in this encounter.     -------------------------------

## 2021-05-27 NOTE — Patient Instructions (Signed)
Reduce Lexapro to 20 mg daily and start fluoxetine 20 mg daily for 1 week, Then increase fluoxetine to 2 of the 20 mg capsules and reduce Lexapro to 10 mg daily for 1 week. Then increase fluoxetine to 3 capsules daily and stop Lexapro

## 2021-06-19 ENCOUNTER — Telehealth: Payer: Self-pay | Admitting: Psychiatry

## 2021-06-19 ENCOUNTER — Other Ambulatory Visit: Payer: Self-pay | Admitting: Psychiatry

## 2021-06-19 NOTE — Telephone Encounter (Signed)
Patient called in today with concerns about Alprazolam '1mg'$ . States that she usually gets a prescription for 3 months with one refill. She only got a month long refill and would like to know why that is. She is worried about running about before refill is due. Pls rtc [phone]

## 2021-06-19 NOTE — Telephone Encounter (Signed)
Please review

## 2021-06-19 NOTE — Telephone Encounter (Signed)
It looks like the reason is because she picked up a prescription for Xanax early.  She got a 90-day prescription on June 17.  The next refill should have been picked up and due mid September but she picked up a 30-day prescription in August which was a month early.  So she will not get another 90-day prescription until after her next appointment.  We will be doing 30-day  refills until then.

## 2021-06-22 NOTE — Telephone Encounter (Signed)
LVM with info

## 2021-06-23 NOTE — Telephone Encounter (Signed)
Thank you for pointing that out.

## 2021-06-23 NOTE — Telephone Encounter (Signed)
You may want to update her instructions to Alprazolam 1 mg 1/2-1 tid, no more than 3 daily #270 so there is no confusion with pharmacy.   Currently the Rx shows #270 for a 67 day supply per instructions of 1/2-1 tablet EVERY 6 HOURS

## 2021-06-30 ENCOUNTER — Telehealth: Payer: Self-pay | Admitting: Psychiatry

## 2021-06-30 NOTE — Telephone Encounter (Signed)
I will give her a call after my apt this afternoon.

## 2021-06-30 NOTE — Telephone Encounter (Signed)
Rtc to pt and she was asking about the taper of her Lexapro to Fluoxetine, she did not get a copy on how to switch over.   We also discussed her Alprazolam refill. I will contact the pharmacy on how a 30 day supply got sent into pharmacy. She reports not requesting it because she knew she had another month but they report it wasn't an early refill and was ordered by Dr. Clovis Pu. Informed her I would follow up with her tomorrow. She was appreciative.

## 2021-06-30 NOTE — Telephone Encounter (Signed)
Pt left a message that she needs to speak to the nurse. She said she received a message from the nurse but she has questions. Please call her at [phone]

## 2021-06-30 NOTE — Telephone Encounter (Signed)
Pt requesting a call from the nurse

## 2021-07-02 NOTE — Telephone Encounter (Signed)
Okay after some investigating, pt received #270 on 03/27/2021 which according to instructions would be a 67 day supply. Pharmacy reports Dr. Clovis Pu sent a Rx on 05/27/2021 #270, pharmacist reports this triggers their system to fill it. With it being early it would only allow a 30 day supply which was #90, she picked it up on 06/03/21. She didn't know why it was filled but they kept calling for her to get it that's why it took a few days for her to pick up.   She had #180 left on that Rx so I canceled it to update instructions. Pt did report she did not request the early refill and pharmacy confirms that.   She does not want Dr. Clovis Pu to think she asked for it early.

## 2021-07-11 DIAGNOSIS — U071 COVID-19: Secondary | ICD-10-CM

## 2021-07-11 HISTORY — DX: COVID-19: U07.1

## 2021-07-23 ENCOUNTER — Other Ambulatory Visit: Payer: Self-pay | Admitting: Psychiatry

## 2021-07-23 ENCOUNTER — Telehealth: Payer: Self-pay | Admitting: Psychiatry

## 2021-07-23 DIAGNOSIS — F4001 Agoraphobia with panic disorder: Secondary | ICD-10-CM

## 2021-07-23 DIAGNOSIS — F411 Generalized anxiety disorder: Secondary | ICD-10-CM

## 2021-07-23 NOTE — Telephone Encounter (Signed)
Pt called stating she had talked to nurse about a med issue with her Xanax 1 mg Rx. [facility] had messed up and filled Rx  early and CC thought it was Pt requesting to refill early. She has not heard anything back from nurse for about a week. Checking status on this issue. Soon due  for refill. Contact pt @ [phone]. Thought it was Vivien Rota she had been talking to about this. I did not see any notes on my end.

## 2021-07-24 ENCOUNTER — Other Ambulatory Visit: Payer: Self-pay | Admitting: Psychiatry

## 2021-07-24 NOTE — Telephone Encounter (Signed)
Last filled 8/17 appt on 11/17

## 2021-07-24 NOTE — Telephone Encounter (Signed)
I don't remember speaking to her about this,but rx pended

## 2021-07-24 NOTE — Telephone Encounter (Signed)
See previous phone messages. Quantity issue but pharmacy error it was not pt requesting too soon

## 2021-08-03 ENCOUNTER — Other Ambulatory Visit: Payer: Self-pay | Admitting: Psychiatry

## 2021-08-03 DIAGNOSIS — F4001 Agoraphobia with panic disorder: Secondary | ICD-10-CM

## 2021-08-03 DIAGNOSIS — G43009 Migraine without aura, not intractable, without status migrainosus: Secondary | ICD-10-CM

## 2021-08-03 DIAGNOSIS — F411 Generalized anxiety disorder: Secondary | ICD-10-CM

## 2021-08-04 NOTE — Telephone Encounter (Signed)
Ok to send imitrex ?

## 2021-08-27 ENCOUNTER — Other Ambulatory Visit: Payer: Self-pay

## 2021-08-27 ENCOUNTER — Encounter: Payer: Self-pay | Admitting: Psychiatry

## 2021-08-27 ENCOUNTER — Ambulatory Visit (INDEPENDENT_AMBULATORY_CARE_PROVIDER_SITE_OTHER): Payer: BC Managed Care – PPO | Admitting: Psychiatry

## 2021-08-27 DIAGNOSIS — F4001 Agoraphobia with panic disorder: Secondary | ICD-10-CM

## 2021-08-27 DIAGNOSIS — F411 Generalized anxiety disorder: Secondary | ICD-10-CM | POA: Diagnosis not present

## 2021-08-27 DIAGNOSIS — G43009 Migraine without aura, not intractable, without status migrainosus: Secondary | ICD-10-CM | POA: Diagnosis not present

## 2021-08-27 MED ORDER — ALPRAZOLAM 1 MG PO TABS: 1.0000 mg | ORAL_TABLET | Freq: Three times a day (TID) | ORAL | 1 refills | Status: DC | PRN

## 2021-08-27 MED ORDER — SUMATRIPTAN SUCCINATE 100 MG PO TABS: ORAL_TABLET | ORAL | 8 refills | Status: DC

## 2021-08-27 MED ORDER — FLUOXETINE HCL 20 MG PO CAPS: 60.0000 mg | ORAL_CAPSULE | Freq: Every day | ORAL | 1 refills | Status: DC

## 2021-08-27 NOTE — Progress Notes (Signed)
Rachel Ryan [id number] 1976-05-31 45 y.o.  Subjective:   Patient ID:  Rachel Ryan is a 45 y.o. (DOB 1976/05/14) female.  Chief Complaint:  Chief Complaint  Patient presents with   Follow-up   Anxiety    Anxiety Symptoms include nervous/anxious behavior. Patient reports no chest pain, confusion, decreased concentration, palpitations, shortness of breath or suicidal ideas.    Carlyon Shadow Hussar presents to the office today for follow-up of anxiety and migraine.  When seen seen in October.  She was under a lot of stress and asking for more Xanax.  Since then she got a 90-day supply of Xanax and it appears she exceeded the dosage recommended taking up to 6 daily.  She then sought an early refill.  She denies this.  She denies exceeding 3 daily average.  She says that [facility] is refilling the wrong dose of 180 ct when it should be #270/90 days for 3 daily.   visit September 2020.  She was doing relatively well emotionally but was concerned about her weight.  Therefore we made the following decision: No med changes today except OK gradual reduction in Lexapro towards a target of 20 mg daily to see if weight gain will be improved.    12/22/19 appt noted: Went down to 20 mg Lexapro for a month but very irritable and angry so increased it back to 30 mg and it's better but not gone.  No energy, don't want to do anything and is irritable.  Situational depression.  Main stress H staying at home all the time.  Daycare and school out a lot so everyone all together all the time and hard on her if doesn't get alone time.  H works from home. Adopted D just started back in person pre-K.  Worries lockdown hurting the kids and her marriage. Average worse up to 14-15  HA monthly last couple of months. Adoption finally done after 3 years.  H been home working since March, not good.  Gets on her nerves. Plan: option Abilify potentiation  07/10/20 appt noted: CO difficulty getting  Xanax refill on time.   Overall about the same with no energy and no desire to do anything.  Stress H working from home but not really working very much.  She wants him to go back to work.   Stress with lockdown.  Never alone now. Not depressed.   HA frequency is about the same.   Pt reports that mood is Anxious and describes anxiety as Moderate. Anxiety symptoms include: Excessive Worry,. Pt reports no sleep issues and Unless H interferes with snoring. Pt reports that appetite is good. Pt reports that energy is poor and no change. Concentration is down slightly. Suicidal thoughts:  denied by patient. Plan: No med changes  04/06/2021 phone call complaining of weight gain apparently from Lexapro.  She was given instructions to gradually reduce from 30 mg to 20 mg and was encouraged to make an earlier appointment so that we could change perhaps to venlafaxine.  05/27/2021 appointment with the following noted: Tried lower dose Lexapro to 20 mg but too irritable and too anxious to manage.  Gained 25# since on Lexapro and didn't lose weight while on 20 mg Lexapro.   Adopted D. And has grown kids. A little panic and anxiety. Propranolol reduced HA from 15/mo to 8/mo. Plan: DT wt gain with Lexapro, Reduce Lexapro to 20 mg daily and start fluoxetine 20 mg daily for 1 week, Then increase fluoxetine to 2  of the 20 mg capsules and reduce Lexapro to 10 mg daily for 1 week. Then increase fluoxetine to 3 capsules daily and stop Lexapro  08/27/2021 appointment with the following noted: Got Covid in Oct mild. Waited until mid Sept to change and I've been fine and lost 4#.  No more anxious than before. Feels a little more giggly and more like herself.   Taking Xanax TID but may be 1/2 in AM.  No panic attacks. Adopted D has been sick a lot lately and hospitalized last week.  45 yo.  Better now.  She was traumatized from the hospital stay with all the needles.   Propanolol for HA..  had a lot of HA during D's 2  week illness. No SE problems. Not depressed.    Imitrex makes HA stop.  Frequency mainly menstrual total 6-7 monthly.  Past Psychiatric Medication Trials:  Doxazosin,  Fluoxetine partial response, sertraline colitis, lexapro 30 SE wt gain,  buspirone  Abilify 5 for couple mos insomnia, helped energy but wt gain  Review of Systems:  Review of Systems  Constitutional:  Negative for fatigue.  Respiratory:  Negative for shortness of breath.   Cardiovascular:  Negative for chest pain and palpitations.  Neurological:  Positive for headaches. Negative for tremors and weakness.  Psychiatric/Behavioral:  Negative for agitation, behavioral problems, confusion, decreased concentration, dysphoric mood, hallucinations, self-injury, sleep disturbance and suicidal ideas. The patient is nervous/anxious. The patient is not hyperactive.    Medications: I have reviewed the patient's current medications.  Current Outpatient Medications  Medication Sig Dispense Refill   Aspirin-Acetaminophen-Caffeine (EXCEDRIN PO) Take 2 tablets by mouth every 8 (eight) hours as needed. headache      diphenhydrAMINE (BENADRYL) 25 mg capsule Take 25 mg by mouth at bedtime as needed. sleep      Multiple Vitamin (MULTIVITAMIN) tablet Take 1 tablet by mouth daily.       propranolol (INDERAL) 20 MG tablet Take 20 mg by mouth 3 (three) times daily.     ALPRAZolam (XANAX) 1 MG tablet Take 1 tablet (1 mg total) by mouth 3 (three) times daily as needed for anxiety. 270 tablet 1   FLUoxetine (PROZAC) 20 MG capsule Take 3 capsules (60 mg total) by mouth daily. 270 capsule 1   SUMAtriptan (IMITREX) 100 MG tablet May repeat in 2 hours if headache persists or recurs. 27 tablet 8   No current facility-administered medications for this visit.    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   Colonoscopy 10/2013 Entocort EC 9 mg daily started   Migraine     Family History  Problem Relation Age of Onset   Colon polyps Mother        cancerous   Colon cancer Mother 70   Breast cancer Paternal Aunt 79   Breast cancer Paternal Grandfather 44   Breast cancer Paternal Aunt 30       Genetic testing - MUTYH mutation   Melanoma Maternal Uncle        dx in his 59s   Breast cancer  Other        MGMs sister dx in her 79s-30s    Social History   Socioeconomic History   Marital status: Married    Spouse name: Not on file   Number of children: 1   Years of education: Not on file   Highest education level: Not on file  Occupational History   Occupation: unemployed  Tobacco Use   Smoking status: Never   Smokeless tobacco: Never  Substance and Sexual Activity   Alcohol use: No   Drug use: No   Sexual activity: Yes    Comment: lmp 09/26/13  Other Topics Concern   Not on file  Social History Narrative   Not on file   Social Determinants of Health   Financial Resource Strain: Not on file  Food Insecurity: Not on file  Transportation Needs: Not on file  Physical Activity: Not on file  Stress: Not on file  Social Connections: Not on file  Intimate Partner Violence: Not on file    Past Medical History, Surgical history, Social history, and Family history were reviewed and updated as appropriate.   Please see review of systems for further details on the patient's review from today.   Objective:   Physical Exam:  There were no vitals taken for this visit.  Physical Exam Constitutional:      General: She is not in acute distress.    Appearance: She is well-developed.  Musculoskeletal:        General: No deformity.  Neurological:     Mental Status: She is  alert and oriented to person, place, and time.     Coordination: Coordination normal.  Psychiatric:        Attention and Perception: Attention and perception normal. She does not perceive auditory or visual hallucinations.        Mood and Affect: Mood is anxious. Mood is not depressed. Affect is not labile, blunt, angry or inappropriate.        Speech: Speech normal. Speech is not slurred.        Behavior: Behavior normal.        Thought Content: Thought content normal. Thought content does not include homicidal or suicidal ideation. Thought content does not include homicidal or suicidal plan.        Cognition and Memory: Cognition and memory normal.        Judgment: Judgment normal.     Comments: Insight intact. No delusions.  Mild irritable.    Lab Review:     Component Value Date/Time   NA 141 03/02/2014 0120   K 3.8 03/02/2014 0120   CL 103 03/02/2014 0120   CO2 22 03/02/2014 0120   GLUCOSE 110 (H) 03/02/2014 0120   BUN 14 03/02/2014 0120   CREATININE 0.70 03/02/2014 0120   CALCIUM 9.4 03/02/2014 0120   GFRNONAA >90 03/02/2014 0120   GFRAA >90 03/02/2014 0120       Component Value Date/Time   WBC 10.7 (H) 03/02/2014 0120   RBC 3.58 (L) 03/02/2014 0120   HGB 12.1 03/02/2014 0120   HCT 35.5 (L) 03/02/2014 0120   PLT 257 03/02/2014 0120   MCV 99.2 03/02/2014 0120   MCH 33.8 03/02/2014 0120   MCHC 34.1 03/02/2014 0120   RDW 12.2 03/02/2014 0120   LYMPHSABS 1.0 03/02/2014 0120   MONOABS 0.6 03/02/2014 0120   EOSABS 0.0 03/02/2014 0120   BASOSABS 0.0 03/02/2014 0120    No results found for: POCLITH, LITHIUM   No results found  for: PHENYTOIN, PHENOBARB, VALPROATE, CBMZ   .res Assessment: Plan:    Generalized anxiety disorder - Plan: FLUoxetine (PROZAC) 20 MG capsule, ALPRAZolam (XANAX) 1 MG tablet  Panic disorder with agoraphobia - Plan: FLUoxetine (PROZAC) 20 MG capsule, ALPRAZolam (XANAX) 1 MG tablet  Migraine without aura and without status migrainosus, not  intractable - Plan: SUMAtriptan (IMITREX) 100 MG tablet   Greater than 50% of 30-minute phone to phone time with patient was spent on counseling and coordination of care.  She does still have some panic attacks sometimes are triggered by migraine headaches and sometimes they are just generally stress related but they are not full panic they are typically limited symptom panic attacks.  She does take the Xanax.  Option Rexulti bc benefit of Abilify but SE with it.  She asks about increasing Xanax.  We would prefer to avoid that.  She appears to have some degree of tolerance developing and this was discussed. Continue xanax 1 mg 1 tablet q 8 hours prn anxiety.  She agrees to the limit of 3 a day. She is taking 3 mg daily.  She denies SE.   She denies abusing any alprazolam.  We discussed the short-term risks associated with benzodiazepines including sedation and increased fall risk among others.  Discussed long-term side effect risk including dependence, potential withdrawal symptoms, and the potential eventual dose-related risk of dementia. Newer studies refute that.   Option switch to Effexor for help with less weight gain.  She prefers return to Prozac bc she knows it didn't make her hungry like the Lexapro.  Disc other alternatives like Trintellix but is less reliably effective for anxiety though it does have less weight gain risk.  Discussed side effects of each of these options in detail.   Disc SE in detail and SSRI withdrawal sx.    Disc with HA frequency she consider Aimovig, Ajovy etc. she is having more headaches but it is likely stress related.  Discussed these medications in detail.  She would likely need to meet criteria by failing a couple of other preventatives such as topiramate etc. before she would qualify.  We discussed other headache preventatives including venlafaxine.  She will consider these.  We are supplying the Imitrex as needed for headaches and they appear reasonably  managed.  FU 3 mo  Lynder Parents, MD, DFAPA   Please see After Visit Summary for patient specific instructions.  No future appointments.  No orders of the defined types were placed in this encounter.     -------------------------------

## 2021-09-22 DIAGNOSIS — Z6827 Body mass index (BMI) 27.0-27.9, adult: Secondary | ICD-10-CM | POA: Diagnosis not present

## 2021-09-22 DIAGNOSIS — Z01419 Encounter for gynecological examination (general) (routine) without abnormal findings: Secondary | ICD-10-CM | POA: Diagnosis not present

## 2021-09-23 DIAGNOSIS — Z01419 Encounter for gynecological examination (general) (routine) without abnormal findings: Secondary | ICD-10-CM | POA: Diagnosis not present

## 2021-10-29 DIAGNOSIS — Z1329 Encounter for screening for other suspected endocrine disorder: Secondary | ICD-10-CM | POA: Diagnosis not present

## 2021-10-29 DIAGNOSIS — Z1321 Encounter for screening for nutritional disorder: Secondary | ICD-10-CM | POA: Diagnosis not present

## 2021-10-29 DIAGNOSIS — Z13228 Encounter for screening for other metabolic disorders: Secondary | ICD-10-CM | POA: Diagnosis not present

## 2021-10-29 DIAGNOSIS — Z13220 Encounter for screening for lipoid disorders: Secondary | ICD-10-CM | POA: Diagnosis not present

## 2021-10-29 DIAGNOSIS — R102 Pelvic and perineal pain: Secondary | ICD-10-CM | POA: Diagnosis not present

## 2021-11-13 NOTE — H&P (Signed)
Patient name DICTATION# CSN#   Arvella Nigh, MD 11/13/2021 7:21 AM     Patient Rachel Ryan, Rachel Ryan DICTATION# [id number] CSN# [id number]  Arvella Nigh, MD 11/13/2021 7:21 AM

## 2021-11-16 ENCOUNTER — Encounter (HOSPITAL_BASED_OUTPATIENT_CLINIC_OR_DEPARTMENT_OTHER): Payer: Self-pay | Admitting: Obstetrics and Gynecology

## 2021-11-16 DIAGNOSIS — R102 Pelvic and perineal pain: Secondary | ICD-10-CM | POA: Diagnosis not present

## 2021-11-16 NOTE — H&P (Signed)
NAME: Rachel Ryan, Rachel Ryan MEDICAL RECORD NO: [id number] ACCOUNT NO: [id number] DATE OF BIRTH: 03-04-1976 PHYSICIAN: Darlyn Chamber, MD  History and Physical   DATE OF ADMISSION: 11/26/2021  Date of her surgery is 11/26/2021.  HISTORY OF PRESENT ILLNESS:  The patient is a 46 year old gravida 2, para 1, abortus 1 female who presents for laparoscopic-assisted vaginal hysterectomy.  The patient has been having issues with progressive abnormal bleeding.  This has been associated  with pelvic pain and dyspareunia.  She had a previous laparoscopic evaluation at [facility] with finding of pelvic endometriosis and adhesions in 2015.  The appendix was removed at that time.  She has had persistent cystic enlargement of the  right ovary, which is relatively small, but felt to represent an endometrioma.  She has been trying to manage this conservatively, but at this point in time now presents for laparoscopic-assisted vaginal hysterectomy, with possible removal of the  ovaries.  The nature of the procedure has been discussed.  ALLERGIES:  SHE IS ALLERGIC TO LEVAQUIN AND SULFA.  MEDICATIONS:  She is on medications for migraine headaches including sumatriptan and propranolol.  PAST MEDICAL HISTORY: She had the usual childhood disease without any significant sequelae.  It is noted did previously undergo the laparoscopy.  Otherwise, she has had a vaginal delivery.  Current birth control is condoms.  SOCIAL HISTORY:  Reveals no tobacco or alcohol use.  FAMILY HISTORY:  Noncontributory.  PHYSICAL EXAMINATION:   VITAL SIGNS:  The patient is afebrile with stable vital signs. HEENT:  The patient is normocephalic.  Pupils equal, round, reactive to light and accommodation.  Extraocular movements were intact.  Sclerae and conjunctivae were clear.  Oropharynx clear. NECK:  Without thyromegaly. BREASTS:  No discrete masses. LUNGS:  Clear. CARDIOVASCULAR:  Regular rhythm and rate without murmurs or  gallops.  No carotid or abdominal bruits. ABDOMEN:  Benign.  No masses, organomegaly, or tenderness. PELVIC: Normal external genitalia.  Vaginal mucosa is clear.  Cervix unremarkable.  Uterus normal size, shape, and contour.  Adnexa free of masses or tenderness. EXTREMITIES:  Trace edema. NEUROLOGIC:  Grossly within normal limits.  IMPRESSION:  1.  Pelvic pain and abnormal bleeding with known pelvic endometriosis, unresponsive to the conservative management. 2.  History of abnormal cervical cytology. 3.  History of endometriosis.  PLAN:  The patient to undergo laparoscopic evaluation with attempt at laparoscopic-assisted vaginal hysterectomy and possible removal of one ovary depending upon the amount of adhesions involved.  Risk of the procedure have been discussed including the  risk of infection.  The risk of hemorrhage that could require transfusion with risk of injury to adjacent organs including bowel, bladder or ureter, this could require further exploratory surgery.  Risk of deep venous thrombosis and pulmonary embolus.  The  patient expressed understanding of the indications and risks.   SHW D: 11/13/2021 7:18:40 am T: 11/13/2021 8:59:00 am  JOB: [id number]/ [id number]

## 2021-11-18 ENCOUNTER — Encounter (HOSPITAL_BASED_OUTPATIENT_CLINIC_OR_DEPARTMENT_OTHER): Payer: Self-pay | Admitting: Obstetrics and Gynecology

## 2021-11-18 ENCOUNTER — Other Ambulatory Visit: Payer: Self-pay

## 2021-11-18 NOTE — Progress Notes (Signed)
Spoke w/ via phone for pre-op interview---pt Lab needs dos----  serum pregnancy             Lab results------11/23/21 Lap appt for CBC, BMP, type & screen COVID test -----patient states asymptomatic no test needed Arrive at -------0530 on Thursday, 11/26/21 NPO after MN NO Solid Food.  Clear liquids from MN until---0430 Med rec completed Medications to take morning of surgery -----Prozac, Xanax prn, Imitrex prn Diabetic medication -----n/a Patient instructed no nail polish to be worn day of surgery Patient instructed to bring photo id and insurance card day of surgery Patient aware to have Driver (ride ) / caregiver    for 24 hours after surgery - husband [place] Patient Special Instructions -----Extended stay instructions given. Pre-Op special Istructions -----Patient stated that if she is not well hydrated she can be a difficult IV stick. Patient verbalized understanding of instructions that were given at this phone interview. Patient denies shortness of breath, chest pain, fever, cough at this phone interview.

## 2021-11-18 NOTE — Progress Notes (Signed)
PLEASE WEAR A MASK OUT IN PUBLIC AND SOCIAL DISTANCE AND [place] YOUR HANDS FREQUENTLY. PLEASE ASK ALL YOUR CLOSE HOUSEHOLD CONTACT TO WEAR MASK OUT IN PUBLIC AND SOCIAL DISTANCE AND [place] HANDS FREQUENTLY ALSO.      Your procedure is scheduled on Thursday, 11/26/21.  Report to [facility] AT  5:30 am.   Call this number if you have problems the morning of surgery  :[phone].   OUR ADDRESS IS [address].  WE ARE LOCATED IN [facility].  PLEASE BRING YOUR INSURANCE CARD AND PHOTO ID DAY OF SURGERY.  ONLY ONE PERSON ALLOWED IN FACILITY WAITING AREA.                                     REMEMBER:  DO NOT EAT FOOD, CANDY GUM OR MINTS  AFTER MIDNIGHT THE NIGHT BEFORE YOUR SURGERY . YOU MAY HAVE CLEAR LIQUIDS FROM MIDNIGHT THE NIGHT BEFORE YOUR SURGERY UNTIL  4:30 am. NO CLEAR LIQUIDS AFTER   4:30 am DAY OF SURGERY.   YOU MAY  BRUSH YOUR TEETH MORNING OF SURGERY AND RINSE YOUR MOUTH OUT, NO CHEWING GUM CANDY OR MINTS.    CLEAR LIQUID DIET   Foods Allowed                                                                     Foods Excluded  Coffee and tea, regular and decaf                             liquids that you cannot  Plain Jell-O any favor except red or purple                                           see through such as: Fruit ices (not with fruit pulp)                                     milk, soups, orange juice  Iced Popsicles                                    All solid food Carbonated beverages, regular and diet                                    Cranberry, grape and apple juices Sports drinks like Gatorade  Sample Menu Breakfast                                Lunch  Supper Cranberry juice                                           Jell-O                                     Grape juice                           Apple juice Coffee or tea                        Jell-O                                       Popsicle                                                Coffee or tea                        Coffee or tea  _____________________________________________________________________     TAKE THESE MEDICATIONS MORNING OF SURGERY WITH A SIP OF WATER:  Prozac, Xanax if needed, Imitrex if needed  ONE VISITOR IS ALLOWED IN WAITING ROOM ONLY DAY OF SURGERY.  YOU MAY HAVE ANOTHER PERSON SWITCH OUT WITH THE  1  VISITOR IN THE WAITING ROOM DAY OF SURGERY AND A MASK MUST BE WORN IN THE WAITING ROOM.    2 VISITORS  MAY VISIT IN THE EXTENDED RECOVERY ROOM UNTIL 800 PM ONLY 1 VISITOR AGE 45 AND OVER MAY SPEND THE NIGHT AND MUST BE IN EXTENDED RECOVERY ROOM NO LATER THAN 800 PM .    UP TO 2 CHILDREN AGE 38 TO 15 MAY ALSO VISIT IN EXTENDED RECOV ERY ROOM ONLY UNTIL 800 PM AND MUST LEAVE BY 800 PM.   ALL PERSONS VISITING IN EXTENDED RECOVERY ROOM MUST WEAR A MASK.                                    DO NOT WEAR JEWERLY, MAKE UP. DO NOT WEAR LOTIONS, POWDERS, PERFUMES OR NAIL POLISH ON YOUR FINGERNAILS. TOENAIL POLISH IS OK TO WEAR. DO NOT SHAVE FOR 48 HOURS PRIOR TO DAY OF SURGERY. MEN MAY SHAVE FACE AND NECK. CONTACTS, GLASSES, OR DENTURES MAY NOT BE WORN TO SURGERY.                                    [facility] IS NOT RESPONSIBLE  FOR ANY BELONGINGS.                                                                    Marland Kitchen  [facility] - Preparing for Surgery Before surgery, you can play an important role.  Because skin is not sterile, your skin needs to be as free of germs as possible.  You can reduce the number of germs on your skin by washing with CHG (chlorahexidine gluconate) soap before surgery.  CHG is an antiseptic cleaner which kills germs and bonds with the skin to continue killing germs even after washing. Please DO NOT use if you have an allergy to CHG or antibacterial soaps.  If your skin becomes reddened/irritated stop using the CHG and inform your nurse when you arrive at Short  Stay. Do not shave (including legs and underarms) for at least 48 hours prior to the first CHG shower.  You may shave your face/neck. Please follow these instructions carefully:  1.  Shower with CHG Soap the night before surgery and the  morning of Surgery.  2.  If you choose to wash your hair, wash your hair first as usual with your  normal  shampoo.  3.  After you shampoo, rinse your hair and body thoroughly to remove the  shampoo.                            4.  Use CHG as you would any other liquid soap.  You can apply chg directly  to the skin and wash                      Gently with a scrungie or clean washcloth.  5.  Apply the CHG Soap to your body ONLY FROM THE NECK DOWN.   Do not use on face/ open                           Wound or open sores. Avoid contact with eyes, ears mouth and genitals (private parts).                       Wash face,  Genitals (private parts) with your normal soap.             6.  Wash thoroughly, paying special attention to the area where your surgery  will be performed.  7.  Thoroughly rinse your body with warm water from the neck down.  8.  DO NOT shower/wash with your normal soap after using and rinsing off  the CHG Soap.                9.  Pat yourself dry with a clean towel.            10.  Wear clean pajamas.            11.  Place clean sheets on your bed the night of your first shower and do not  sleep with pets. Day of Surgery : Do not apply any lotions/deodorants the morning of surgery.  Please wear clean clothes to the hospital/surgery center.  IF YOU HAVE ANY SKIN IRRITATION OR PROBLEMS WITH THE SURGICAL SOAP, PLEASE GET A BAR OF GOLD DIAL SOAP AND SHOWER THE NIGHT BEFORE YOUR SURGERY AND THE MORNING OF YOUR SURGERY. PLEASE LET THE NURSE KNOW MORNING OF YOUR SURGERY IF YOU HAD ANY PROBLEMS WITH THE SURGICAL SOAP.   ________________________________________________________________________  QUESTIONS CALL Cordarious Zeek PRE OP NURSE PHONE [phone].

## 2021-11-23 ENCOUNTER — Encounter (HOSPITAL_COMMUNITY)
Admission: RE | Admit: 2021-11-23 | Discharge: 2021-11-23 | Disposition: A | Discharge status: Home / Self Care | Payer: BC Managed Care – PPO | Source: Ambulatory Visit | Attending: Obstetrics and Gynecology | Admitting: Obstetrics and Gynecology

## 2021-11-23 ENCOUNTER — Other Ambulatory Visit: Payer: Self-pay

## 2021-11-23 DIAGNOSIS — D259 Leiomyoma of uterus, unspecified: Secondary | ICD-10-CM | POA: Diagnosis not present

## 2021-11-23 DIAGNOSIS — N939 Abnormal uterine and vaginal bleeding, unspecified: Secondary | ICD-10-CM | POA: Diagnosis not present

## 2021-11-23 DIAGNOSIS — Z01812 Encounter for preprocedural laboratory examination: Secondary | ICD-10-CM | POA: Insufficient documentation

## 2021-11-23 DIAGNOSIS — F411 Generalized anxiety disorder: Secondary | ICD-10-CM | POA: Insufficient documentation

## 2021-11-23 DIAGNOSIS — N838 Other noninflammatory disorders of ovary, fallopian tube and broad ligament: Secondary | ICD-10-CM | POA: Diagnosis not present

## 2021-11-23 DIAGNOSIS — N8000 Endometriosis of the uterus, unspecified: Secondary | ICD-10-CM | POA: Diagnosis not present

## 2021-11-23 LAB — CBC
HCT: 39.3 % (ref 36.0–46.0)
Hemoglobin: 12.8 g/dL (ref 12.0–15.0)
MCH: 33.3 pg (ref 26.0–34.0)
MCHC: 32.6 g/dL (ref 30.0–36.0)
MCV: 102.3 fL — ABNORMAL HIGH (ref 80.0–100.0)
Platelets: 313 10*3/uL (ref 150–400)
RBC: 3.84 MIL/uL — ABNORMAL LOW (ref 3.87–5.11)
RDW: 13.2 % (ref 11.5–15.5)
WBC: 4.9 10*3/uL (ref 4.0–10.5)
nRBC: 0 % (ref 0.0–0.2)

## 2021-11-23 LAB — BASIC METABOLIC PANEL
Anion gap: 7 (ref 5–15)
BUN: 14 mg/dL (ref 6–20)
CO2: 24 mmol/L (ref 22–32)
Calcium: 9.2 mg/dL (ref 8.9–10.3)
Chloride: 104 mmol/L (ref 98–111)
Creatinine, Ser: 0.71 mg/dL (ref 0.44–1.00)
GFR, Estimated: >60 mL/min (ref >60)
Glucose, Bld: 98 mg/dL (ref 70–99)
Potassium: 4 mmol/L (ref 3.5–5.1)
Sodium: 135 mmol/L (ref 135–145)

## 2021-11-26 ENCOUNTER — Encounter (HOSPITAL_BASED_OUTPATIENT_CLINIC_OR_DEPARTMENT_OTHER): Payer: Self-pay | Admitting: Obstetrics and Gynecology

## 2021-11-26 ENCOUNTER — Observation Stay (HOSPITAL_BASED_OUTPATIENT_CLINIC_OR_DEPARTMENT_OTHER)
Admission: RE | Admit: 2021-11-26 | Discharge: 2021-11-27 | Disposition: A | Discharge status: Home / Self Care | Payer: BC Managed Care – PPO | Attending: Obstetrics and Gynecology | Admitting: Obstetrics and Gynecology

## 2021-11-26 ENCOUNTER — Observation Stay (HOSPITAL_BASED_OUTPATIENT_CLINIC_OR_DEPARTMENT_OTHER): Payer: BC Managed Care – PPO | Admitting: Certified Registered"

## 2021-11-26 ENCOUNTER — Other Ambulatory Visit: Payer: Self-pay

## 2021-11-26 ENCOUNTER — Encounter (HOSPITAL_BASED_OUTPATIENT_CLINIC_OR_DEPARTMENT_OTHER)
Admission: RE | Disposition: A | Discharge status: Home / Self Care | Payer: Self-pay | Source: Home / Self Care | Attending: Obstetrics and Gynecology

## 2021-11-26 DIAGNOSIS — Z90710 Acquired absence of both cervix and uterus: Secondary | ICD-10-CM | POA: Diagnosis present

## 2021-11-26 DIAGNOSIS — N809 Endometriosis, unspecified: Secondary | ICD-10-CM

## 2021-11-26 DIAGNOSIS — D259 Leiomyoma of uterus, unspecified: Principal | ICD-10-CM | POA: Insufficient documentation

## 2021-11-26 DIAGNOSIS — F419 Anxiety disorder, unspecified: Secondary | ICD-10-CM

## 2021-11-26 DIAGNOSIS — N8000 Endometriosis of the uterus, unspecified: Secondary | ICD-10-CM | POA: Diagnosis not present

## 2021-11-26 DIAGNOSIS — N939 Abnormal uterine and vaginal bleeding, unspecified: Secondary | ICD-10-CM | POA: Diagnosis not present

## 2021-11-26 DIAGNOSIS — Z1379 Encounter for other screening for genetic and chromosomal anomalies: Secondary | ICD-10-CM

## 2021-11-26 DIAGNOSIS — F411 Generalized anxiety disorder: Secondary | ICD-10-CM

## 2021-11-26 DIAGNOSIS — K52832 Lymphocytic colitis: Secondary | ICD-10-CM

## 2021-11-26 DIAGNOSIS — R102 Pelvic and perineal pain: Secondary | ICD-10-CM | POA: Diagnosis not present

## 2021-11-26 DIAGNOSIS — D251 Intramural leiomyoma of uterus: Secondary | ICD-10-CM | POA: Diagnosis not present

## 2021-11-26 DIAGNOSIS — N838 Other noninflammatory disorders of ovary, fallopian tube and broad ligament: Secondary | ICD-10-CM | POA: Insufficient documentation

## 2021-11-26 DIAGNOSIS — N736 Female pelvic peritoneal adhesions (postinfective): Secondary | ICD-10-CM | POA: Diagnosis not present

## 2021-11-26 DIAGNOSIS — Z803 Family history of malignant neoplasm of breast: Secondary | ICD-10-CM

## 2021-11-26 DIAGNOSIS — Z800 Family history of malignant neoplasm of digestive organs: Secondary | ICD-10-CM

## 2021-11-26 DIAGNOSIS — F458 Other somatoform disorders: Secondary | ICD-10-CM

## 2021-11-26 HISTORY — DX: Prediabetes: R73.03

## 2021-11-26 HISTORY — PX: CYSTOSCOPY: SHX5120

## 2021-11-26 HISTORY — DX: Other specified postprocedural states: Z98.890

## 2021-11-26 HISTORY — PX: VAGINAL HYSTERECTOMY: SHX2639

## 2021-11-26 HISTORY — DX: Hypothyroidism, unspecified: E03.9

## 2021-11-26 HISTORY — PX: ABDOMINAL HYSTERECTOMY: SHX81

## 2021-11-26 HISTORY — DX: Other specified postprocedural states: R11.2

## 2021-11-26 HISTORY — DX: Unspecified osteoarthritis, unspecified site: M19.90

## 2021-11-26 HISTORY — DX: Presence of spectacles and contact lenses: Z97.3

## 2021-11-26 HISTORY — DX: Gastro-esophageal reflux disease without esophagitis: K21.9

## 2021-11-26 LAB — TYPE AND SCREEN
ABO/RH(D): A POS
Antibody Screen: NEGATIVE

## 2021-11-26 LAB — HCG, SERUM, QUALITATIVE: Preg, Serum: NEGATIVE

## 2021-11-26 LAB — ABO/RH: ABO/RH(D): A POS

## 2021-11-26 SURGERY — HYSTERECTOMY, VAGINAL
Anesthesia: General | Site: Urethra

## 2021-11-26 MED ORDER — ALPRAZOLAM 0.5 MG PO TABS
ORAL_TABLET | ORAL | Status: AC
  Filled 2021-11-26: qty 1

## 2021-11-26 MED ORDER — SCOPOLAMINE 1 MG/3DAYS TD PT72
1.0000 | MEDICATED_PATCH | TRANSDERMAL | Status: DC
  Administered 2021-11-26: 1.5 mg via TRANSDERMAL

## 2021-11-26 MED ORDER — HYDROMORPHONE HCL 1 MG/ML IJ SOLN
1.0000 mg | INTRAMUSCULAR | Status: DC | PRN
  Administered 2021-11-26 – 2021-11-27 (×3): 0.5 mg via INTRAVENOUS

## 2021-11-26 MED ORDER — DROPERIDOL 2.5 MG/ML IJ SOLN
INTRAMUSCULAR | Status: DC | PRN
  Administered 2021-11-26: .625 mg via INTRAVENOUS

## 2021-11-26 MED ORDER — HYDROMORPHONE HCL 1 MG/ML IJ SOLN
INTRAMUSCULAR | Status: AC
  Filled 2021-11-26: qty 1

## 2021-11-26 MED ORDER — BUPIVACAINE LIPOSOME 1.3 % IJ SUSP
INTRAMUSCULAR | Status: DC | PRN
  Administered 2021-11-26: 13.5 mL

## 2021-11-26 MED ORDER — ONDANSETRON HCL 4 MG/2ML IJ SOLN
INTRAMUSCULAR | Status: AC
  Filled 2021-11-26: qty 2

## 2021-11-26 MED ORDER — HYDROMORPHONE HCL 1 MG/ML IJ SOLN
INTRAMUSCULAR | Status: DC | PRN
  Administered 2021-11-26 (×2): .5 mg via INTRAVENOUS

## 2021-11-26 MED ORDER — FLUOXETINE HCL 20 MG PO CAPS
60.0000 mg | ORAL_CAPSULE | Freq: Every day | ORAL | Status: DC
Start: 2021-11-26 — End: 2021-11-27
  Administered 2021-11-27: 60 mg via ORAL
  Filled 2021-11-26: qty 3

## 2021-11-26 MED ORDER — SUMATRIPTAN SUCCINATE 50 MG PO TABS
100.0000 mg | ORAL_TABLET | Freq: Once | ORAL | Status: AC
  Administered 2021-11-26: 100 mg via ORAL
  Filled 2021-11-26: qty 2

## 2021-11-26 MED ORDER — LIDOCAINE 2% (20 MG/ML) 5 ML SYRINGE
INTRAMUSCULAR | Status: DC | PRN
  Administered 2021-11-26: 100 mg via INTRAVENOUS

## 2021-11-26 MED ORDER — DIPHENHYDRAMINE HCL 50 MG/ML IJ SOLN
INTRAMUSCULAR | Status: DC | PRN
  Administered 2021-11-26: 6.25 mg via INTRAVENOUS

## 2021-11-26 MED ORDER — DEXAMETHASONE SODIUM PHOSPHATE 10 MG/ML IJ SOLN
INTRAMUSCULAR | Status: DC | PRN
  Administered 2021-11-26: 10 mg via INTRAVENOUS

## 2021-11-26 MED ORDER — DIPHENHYDRAMINE HCL 50 MG/ML IJ SOLN
INTRAMUSCULAR | Status: AC
  Filled 2021-11-26: qty 1

## 2021-11-26 MED ORDER — ONDANSETRON 4 MG PO TBDP
ORAL_TABLET | ORAL | Status: AC
Start: 2021-11-26 — End: ?
  Filled 2021-11-26: qty 1

## 2021-11-26 MED ORDER — MENTHOL 3 MG MT LOZG: 1.0000 | LOZENGE | OROMUCOSAL | Status: DC | PRN

## 2021-11-26 MED ORDER — KETOROLAC TROMETHAMINE 30 MG/ML IJ SOLN
INTRAMUSCULAR | Status: AC
  Filled 2021-11-26: qty 1

## 2021-11-26 MED ORDER — HYDROMORPHONE 1 MG/ML IV SOLN
INTRAVENOUS | Status: DC
  Administered 2021-11-26: 1 mL via INTRAVENOUS
  Filled 2021-11-26: qty 30

## 2021-11-26 MED ORDER — MIDAZOLAM HCL 5 MG/5ML IJ SOLN
INTRAMUSCULAR | Status: DC | PRN
  Administered 2021-11-26: 2 mg via INTRAVENOUS

## 2021-11-26 MED ORDER — OXYCODONE-ACETAMINOPHEN 5-325 MG PO TABS
ORAL_TABLET | ORAL | Status: AC
  Filled 2021-11-26: qty 2

## 2021-11-26 MED ORDER — DEXMEDETOMIDINE (PRECEDEX) IN NS 20 MCG/5ML (4 MCG/ML) IV SYRINGE
PREFILLED_SYRINGE | INTRAVENOUS | Status: DC | PRN
  Administered 2021-11-26 (×2): 4 ug via INTRAVENOUS

## 2021-11-26 MED ORDER — 0.9 % SODIUM CHLORIDE (POUR BTL) OPTIME
TOPICAL | Status: DC | PRN
  Administered 2021-11-26: 500 mL
  Administered 2021-11-26: 2000 mL

## 2021-11-26 MED ORDER — DIPHENHYDRAMINE HCL 12.5 MG/5ML PO ELIX: 12.5000 mg | ORAL_SOLUTION | Freq: Four times a day (QID) | ORAL | Status: DC | PRN

## 2021-11-26 MED ORDER — CEFAZOLIN SODIUM-DEXTROSE 2-4 GM/100ML-% IV SOLN
INTRAVENOUS | Status: AC
  Filled 2021-11-26: qty 100

## 2021-11-26 MED ORDER — PROPRANOLOL HCL 20 MG PO TABS
30.0000 mg | ORAL_TABLET | Freq: Every day | ORAL | Status: DC
  Administered 2021-11-27: 30 mg via ORAL
  Filled 2021-11-26: qty 1

## 2021-11-26 MED ORDER — ONDANSETRON HCL 4 MG/2ML IJ SOLN: 4.0000 mg | Freq: Four times a day (QID) | INTRAMUSCULAR | Status: DC | PRN

## 2021-11-26 MED ORDER — SODIUM CHLORIDE 0.9 % IR SOLN
Status: DC | PRN
  Administered 2021-11-26: 200 mL

## 2021-11-26 MED ORDER — HYDROMORPHONE HCL 1 MG/ML IJ SOLN
0.2500 mg | INTRAMUSCULAR | Status: DC | PRN
  Administered 2021-11-26 (×3): 0.25 mg via INTRAVENOUS

## 2021-11-26 MED ORDER — KETOROLAC TROMETHAMINE 30 MG/ML IJ SOLN
INTRAMUSCULAR | Status: DC | PRN
  Administered 2021-11-26: 30 mg via INTRAVENOUS

## 2021-11-26 MED ORDER — FENTANYL CITRATE (PF) 250 MCG/5ML IJ SOLN
INTRAMUSCULAR | Status: AC
  Filled 2021-11-26: qty 5

## 2021-11-26 MED ORDER — FENTANYL CITRATE (PF) 100 MCG/2ML IJ SOLN
INTRAMUSCULAR | Status: DC | PRN
  Administered 2021-11-26 (×3): 50 ug via INTRAVENOUS

## 2021-11-26 MED ORDER — ONDANSETRON HCL 4 MG/2ML IJ SOLN
INTRAMUSCULAR | Status: DC | PRN
  Administered 2021-11-26: 4 mg via INTRAVENOUS

## 2021-11-26 MED ORDER — CEFAZOLIN SODIUM-DEXTROSE 2-4 GM/100ML-% IV SOLN
2.0000 g | INTRAVENOUS | Status: AC
  Administered 2021-11-26: 2 g via INTRAVENOUS

## 2021-11-26 MED ORDER — SCOPOLAMINE 1 MG/3DAYS TD PT72
MEDICATED_PATCH | TRANSDERMAL | Status: AC
Start: 2021-11-26 — End: ?
  Filled 2021-11-26: qty 1

## 2021-11-26 MED ORDER — ROCURONIUM BROMIDE 100 MG/10ML IV SOLN
INTRAVENOUS | Status: DC | PRN
  Administered 2021-11-26: 10 mg via INTRAVENOUS
  Administered 2021-11-26: 20 mg via INTRAVENOUS
  Administered 2021-11-26: 10 mg via INTRAVENOUS
  Administered 2021-11-26: 60 mg via INTRAVENOUS

## 2021-11-26 MED ORDER — LACTATED RINGERS IV SOLN: INTRAVENOUS | Status: DC

## 2021-11-26 MED ORDER — ACETAMINOPHEN 10 MG/ML IV SOLN
INTRAVENOUS | Status: AC
Start: 2021-11-26 — End: ?
  Filled 2021-11-26: qty 100

## 2021-11-26 MED ORDER — WHITE PETROLATUM EX OINT
TOPICAL_OINTMENT | CUTANEOUS | Status: AC
  Filled 2021-11-26: qty 5

## 2021-11-26 MED ORDER — PROMETHAZINE HCL 25 MG/ML IJ SOLN: 6.2500 mg | INTRAMUSCULAR | Status: DC | PRN

## 2021-11-26 MED ORDER — SODIUM CHLORIDE 0.9% FLUSH: 9.0000 mL | INTRAVENOUS | Status: DC | PRN

## 2021-11-26 MED ORDER — SUGAMMADEX SODIUM 200 MG/2ML IV SOLN
INTRAVENOUS | Status: DC | PRN
  Administered 2021-11-26: 150 mg via INTRAVENOUS

## 2021-11-26 MED ORDER — BUPIVACAINE HCL (PF) 0.25 % IJ SOLN
INTRAMUSCULAR | Status: DC | PRN
  Administered 2021-11-26: 17.5 mL
  Administered 2021-11-26: 4 mL

## 2021-11-26 MED ORDER — OXYCODONE-ACETAMINOPHEN 5-325 MG PO TABS
1.0000 | ORAL_TABLET | ORAL | Status: DC | PRN
  Administered 2021-11-26: 2 via ORAL
  Administered 2021-11-27: 1 via ORAL

## 2021-11-26 MED ORDER — MIDAZOLAM HCL 2 MG/2ML IJ SOLN
INTRAMUSCULAR | Status: AC
  Filled 2021-11-26: qty 2

## 2021-11-26 MED ORDER — ONDANSETRON HCL 4 MG PO TABS
4.0000 mg | ORAL_TABLET | Freq: Four times a day (QID) | ORAL | Status: DC | PRN
  Administered 2021-11-26 (×2): 4 mg via ORAL

## 2021-11-26 MED ORDER — HEMOSTATIC AGENTS (NO CHARGE) OPTIME
TOPICAL | Status: DC | PRN
  Administered 2021-11-26: 1

## 2021-11-26 MED ORDER — PROPOFOL 10 MG/ML IV BOLUS
INTRAVENOUS | Status: DC | PRN
  Administered 2021-11-26: 120 mg via INTRAVENOUS

## 2021-11-26 MED ORDER — NALOXONE HCL 0.4 MG/ML IJ SOLN: 0.4000 mg | INTRAMUSCULAR | Status: DC | PRN

## 2021-11-26 MED ORDER — OXYCODONE HCL 5 MG PO TABS
ORAL_TABLET | ORAL | Status: AC
  Filled 2021-11-26: qty 1

## 2021-11-26 MED ORDER — OXYCODONE HCL 5 MG/5ML PO SOLN: 5.0000 mg | Freq: Once | ORAL | Status: AC | PRN

## 2021-11-26 MED ORDER — HYDROMORPHONE HCL 2 MG/ML IJ SOLN
INTRAMUSCULAR | Status: AC
  Filled 2021-11-26: qty 1

## 2021-11-26 MED ORDER — DEXAMETHASONE SODIUM PHOSPHATE 10 MG/ML IJ SOLN
INTRAMUSCULAR | Status: AC
  Filled 2021-11-26: qty 1

## 2021-11-26 MED ORDER — PROPOFOL 10 MG/ML IV BOLUS
INTRAVENOUS | Status: AC
  Filled 2021-11-26: qty 20

## 2021-11-26 MED ORDER — ONDANSETRON HCL 4 MG/2ML IJ SOLN
INTRAMUSCULAR | Status: AC
Start: 2021-11-26 — End: ?
  Filled 2021-11-26: qty 2

## 2021-11-26 MED ORDER — LIDOCAINE HCL (PF) 2 % IJ SOLN
INTRAMUSCULAR | Status: AC
Start: 2021-11-26 — End: ?
  Filled 2021-11-26: qty 5

## 2021-11-26 MED ORDER — ALPRAZOLAM 0.5 MG PO TABS
1.0000 mg | ORAL_TABLET | Freq: Three times a day (TID) | ORAL | Status: DC | PRN
  Administered 2021-11-26: 1 mg via ORAL

## 2021-11-26 MED ORDER — SODIUM CHLORIDE 0.9 % IV SOLN
12.5000 mg | INTRAVENOUS | Status: DC | PRN
  Administered 2021-11-26 – 2021-11-27 (×2): 12.5 mg via INTRAVENOUS
  Filled 2021-11-26: qty 12.5
  Filled 2021-11-26: qty 0.5
  Filled 2021-11-26: qty 12.5

## 2021-11-26 MED ORDER — OXYCODONE HCL 5 MG PO TABS
5.0000 mg | ORAL_TABLET | Freq: Once | ORAL | Status: AC | PRN
  Administered 2021-11-26: 5 mg via ORAL

## 2021-11-26 MED ORDER — ACETAMINOPHEN 10 MG/ML IV SOLN
INTRAVENOUS | Status: DC | PRN
Start: 2021-11-26 — End: 2021-11-26
  Administered 2021-11-26: 1000 mg via INTRAVENOUS

## 2021-11-26 MED ORDER — DIPHENHYDRAMINE HCL 50 MG/ML IJ SOLN: 12.5000 mg | Freq: Four times a day (QID) | INTRAMUSCULAR | Status: DC | PRN

## 2021-11-26 MED ORDER — ROCURONIUM BROMIDE 10 MG/ML (PF) SYRINGE
PREFILLED_SYRINGE | INTRAVENOUS | Status: AC
  Filled 2021-11-26: qty 10

## 2021-11-26 MED ORDER — POVIDONE-IODINE 10 % EX SWAB
2.0000 "application " | Freq: Once | CUTANEOUS | Status: AC
  Administered 2021-11-26: 2 via TOPICAL

## 2021-11-26 MED ORDER — ONDANSETRON 4 MG PO TBDP
ORAL_TABLET | ORAL | Status: AC
  Filled 2021-11-26: qty 1

## 2021-11-26 MED ORDER — KETOROLAC TROMETHAMINE 30 MG/ML IJ SOLN: 30.0000 mg | Freq: Once | INTRAMUSCULAR | Status: AC | PRN

## 2021-11-26 SURGICAL SUPPLY — 62 items
ADH SKN CLS APL DERMABOND .7 (GAUZE/BANDAGES/DRESSINGS) ×4
CATH ROBINSON RED A/P 16FR (CATHETERS) ×3 IMPLANT
COVER BACK TABLE 60X90IN (DRAPES) ×3 IMPLANT
COVER MAYO STAND STRL (DRAPES) ×6 IMPLANT
DERMABOND ADVANCED (GAUZE/BANDAGES/DRESSINGS) ×2
DERMABOND ADVANCED .7 DNX12 (GAUZE/BANDAGES/DRESSINGS) ×2 IMPLANT
DRAPE WARM FLUID 44X44 (DRAPES) ×1 IMPLANT
DRSG OPSITE POSTOP 4X10 (GAUZE/BANDAGES/DRESSINGS) ×1 IMPLANT
DURAPREP 26ML APPLICATOR (WOUND CARE) ×3 IMPLANT
ELECT REM PT RETURN 9FT ADLT (ELECTROSURGICAL) ×3
ELECTRODE REM PT RTRN 9FT ADLT (ELECTROSURGICAL) ×2 IMPLANT
GAUZE 4X4 16PLY ~~LOC~~+RFID DBL (SPONGE) ×8 IMPLANT
GLOVE SURG ENC MOIS LTX SZ6.5 (GLOVE) ×2 IMPLANT
GLOVE SURG ENC MOIS LTX SZ7 (GLOVE) ×12 IMPLANT
GLOVE SURG ENC MOIS LTX SZ8.5 (GLOVE) ×1 IMPLANT
GLOVE SURG LTX SZ6.5 (GLOVE) ×3 IMPLANT
GLOVE SURG UNDER POLY LF SZ6.5 (GLOVE) ×2 IMPLANT
GLOVE SURG UNDER POLY LF SZ7 (GLOVE) ×1 IMPLANT
GLOVE SURG UNDER POLY LF SZ8.5 (GLOVE) ×1 IMPLANT
GOWN STRL REUS W/TWL XL LVL3 (GOWN DISPOSABLE) ×1 IMPLANT
HEMOSTAT ARISTA ABSORB 3G PWDR (HEMOSTASIS) ×1 IMPLANT
HOLDER FOLEY CATH W/STRAP (MISCELLANEOUS) ×4 IMPLANT
IV NS 1000ML (IV SOLUTION) ×3
IV NS 1000ML BAXH (IV SOLUTION) IMPLANT
KIT TURNOVER CYSTO (KITS) ×3 IMPLANT
NS IRRIG 1000ML POUR BTL (IV SOLUTION) ×2 IMPLANT
NS IRRIG 500ML POUR BTL (IV SOLUTION) ×3 IMPLANT
PACK LAVH (CUSTOM PROCEDURE TRAY) ×3 IMPLANT
PACK ROBOTIC GOWN (GOWN DISPOSABLE) ×3 IMPLANT
PACK TRENDGUARD 450 HYBRID PRO (MISCELLANEOUS) IMPLANT
PAD OB MATERNITY 4.3X12.25 (PERSONAL CARE ITEMS) ×3 IMPLANT
PAD PREP 24X48 CUFFED NSTRL (MISCELLANEOUS) ×3 IMPLANT
SEALER TISSUE G2 CVD JAW 45CM (ENDOMECHANICALS) ×3 IMPLANT
SET IRRIG Y TYPE TUR BLADDER L (SET/KITS/TRAYS/PACK) ×1 IMPLANT
SET SUCTION IRRIG HYDROSURG (IRRIGATION / IRRIGATOR) ×3 IMPLANT
SET TUBE SMOKE EVAC HIGH FLOW (TUBING) ×3 IMPLANT
SOLUTION ELECTROLUBE (MISCELLANEOUS) IMPLANT
SPONGE T-LAP 18X18 ~~LOC~~+RFID (SPONGE) ×2 IMPLANT
SPONGE T-LAP 4X18 ~~LOC~~+RFID (SPONGE) ×3 IMPLANT
SUT MNCRL AB 4-0 PS2 18 (SUTURE) ×1 IMPLANT
SUT PDS AB 0 CT 36 (SUTURE) ×1 IMPLANT
SUT PDS AB 0 CTX 60 (SUTURE) ×1 IMPLANT
SUT PLAIN 2 0 XLH (SUTURE) ×1 IMPLANT
SUT VIC AB 0 CT1 18XCR BRD8 (SUTURE) ×4 IMPLANT
SUT VIC AB 0 CT1 36 (SUTURE) ×6 IMPLANT
SUT VIC AB 0 CT1 8-18 (SUTURE) ×15
SUT VIC AB 2-0 CT1 (SUTURE) IMPLANT
SUT VIC AB 2-0 SH 27 (SUTURE) ×3
SUT VIC AB 2-0 SH 27XBRD (SUTURE) ×2 IMPLANT
SUT VIC AB 3-0 SH 27 (SUTURE) ×3
SUT VIC AB 3-0 SH 27X BRD (SUTURE) ×2 IMPLANT
SUT VIC AB 4-0 PS2 18 (SUTURE) ×3 IMPLANT
SUT VICRYL 0 UR6 27IN ABS (SUTURE) ×1 IMPLANT
SUT VICRYL 1 TIES 12X18 (SUTURE) ×3 IMPLANT
SYR BULB IRRIG 60ML STRL (SYRINGE) ×1 IMPLANT
TOWEL OR 17X26 10 PK STRL BLUE (TOWEL DISPOSABLE) ×3 IMPLANT
TRAY FOLEY W/BAG SLVR 14FR LF (SET/KITS/TRAYS/PACK) ×3 IMPLANT
TRENDGUARD 450 HYBRID PRO PACK (MISCELLANEOUS) ×3
TROCAR BALLN 12MMX100 BLUNT (TROCAR) ×1 IMPLANT
TROCAR OPTI TIP 5M 100M (ENDOMECHANICALS) ×3 IMPLANT
WARMER LAPAROSCOPE (MISCELLANEOUS) ×3 IMPLANT
WATER STERILE IRR 500ML POUR (IV SOLUTION) ×3 IMPLANT

## 2021-11-26 NOTE — Progress Notes (Signed)
Patient name   Rachel Ryan, Rachel Ryan DICTATION# [id number] CSN# [id number]  Arvella Nigh, MD 11/26/2021 1:19 PM

## 2021-11-26 NOTE — Brief Op Note (Signed)
11/26/2021  9:57 AM  PATIENT:  Rachel Ryan  46 y.o. female  PRE-OPERATIVE DIAGNOSIS:  PELVIC PAIN  POST-OPERATIVE DIAGNOSIS:  PELVIC PAIN  PROCEDURE:  Procedure(s): ATTEMPTED LAPAROSCOPIC ASSISTED VAGINAL HYSTERECTOMY, possible removal of ovaries (N/A) HYSTERECTOMY ABDOMINAL, BILATERAL SALPINGECTOMY (N/A) CYSTOSCOPY  SURGEON:  Surgeon(s) and Role:    * Arvella Nigh, MD - Primary    * Royston Sinner, Colin Benton, MD - Assisting  PHYSICIAN ASSISTANT:   ASSISTANTS: leger   ANESTHESIA:   general  EBL:  200 mL   BLOOD ADMINISTERED:none  DRAINS: Urinary Catheter (Foley)   LOCAL MEDICATIONS USED:  MARCAINE     SPECIMEN:  Source of Specimen:  uterus and both tubes  DISPOSITION OF SPECIMEN:  PATHOLOGY  COUNTS:  YES  TOURNIQUET:  * No tourniquets in log *  DICTATION: .Note written in EPIC  PLAN OF CARE: Admit for overnight observation  PATIENT DISPOSITION:  PACU - hemodynamically stable.   Delay start of Pharmacological VTE agent (>24hrs) due to surgical blood loss or risk of bleeding: yes

## 2021-11-26 NOTE — Progress Notes (Signed)
Af vss Abd soft Inc clear Good uo 

## 2021-11-26 NOTE — Op Note (Signed)
NAME: Rachel Ryan, TRZCINSKI MEDICAL RECORD NO: [id number] ACCOUNT NO: [id number] DATE OF BIRTH: 02-16-1976 FACILITY: [facility] LOCATION: [facility] PHYSICIAN: Darlyn Chamber, MD  Operative Report   DATE OF PROCEDURE: 11/26/2021  PREOPERATIVE DIAGNOSES:  Abnormal uterine bleeding with a history of pelvic endometriosis and pelvic pain.  POSTOPERATIVE DIAGNOSES:  Abnormal uterine bleeding with a history of pelvic endometriosis and pelvic pain with extensive pelvic adhesions.  OPERATIVE PROCEDURE:  Laparoscopic evaluation.  Subsequent exploratory laparotomy with total abdominal hysterectomy, removal of both fallopian tubes.  Lysis of adhesions.  Cystoscopy.  SURGEON:  Darlyn Chamber, MD  ASSISTANT:  Alfred Levins.  ANESTHESIA:  General endotracheal.  ESTIMATED BLOOD LOSS:  300-400 mL.  PACKS:  None.  DRAINS:  Include urethral Foley.  INTRAOPERATIVE BLOOD PLACEMENT:  None.  COMPLICATIONS:  None.  INDICATIONS:  Dictated in history and physical.  DESCRIPTION OF PROCEDURE:  The patient was taken to the OR and placed in the supine position.  After satisfactory level of general endotracheal anesthesia was obtained, the patient was placed in the dorsal lithotomy position using the Allen stirrups.   Perineum and vagina were prepped out with antiseptic solution.  Bladder was emptied via catheterization.  A Hulka tenaculum was put in place and secured.  Abdomen was prepped with DuraPrep.  After a period of time, she was draped as a sterile field.   Subumbilical incision was made with knife and extended through the subcutaneous tissue.  Fascia was identified and entered sharply.  Incision in the fascia extended laterally.  Peritoneum was entered with blunt finger pressure.  The open laparoscopic  trocar was put in place and secured.  Abdomen was deflated with carbon dioxide.  Laparoscope was introduced.  A 5 mm trocar was put in place in the suprapubic area under direct visualization.  The patient was placed  in Trendelenburg.  On visualization,  she had an endometrioma of the right ovary.  She had adhesions to the right ovary, right pelvic sidewall and uterus.  The left ovary was densely adherent to the left pelvic sidewall as well as the uterus.  There were numerous adhesions to the back of the  uterus and the colon.  The adhesions were extensive.  The decision at this time was being exploratory surgery to complete her operation.  At this point in time, the laparoscope was removed.  The abdomen was deflated with carbon dioxide.  All trocars were removed.  Subumbilical fascia closed with 2 figure-of-eights of 0 Vicryl.  Skin was closed with interrupted subcuticulars of 4-0 Vicryl.   A low transverse incision was made with a knife and extended through the subcutaneous tissue.  Anterior rectus fascia was entered sharply and incision in the fascia extended laterally.  Fascia was taken off the muscle superiorly and inferiorly.  Rectus  muscles were separated in the midline.  Peritoneum was entered sharply.  Incision in the peritoneum extended both superiorly and inferiorly.  O'Connor-O'Sullivan retractor was put in place.  Bowel contents were packed superiorly out of the pelvic cavity.   Uterus was identified and elevated.  Uteroovarian pedicles were clamped with Kellys.  Again, the uterus was elevated.  We first went to the left side.  The left round ligament was suture ligated and excised.  Then, the bladder flap was developed on the  left side.  A hole was made in the avascular area of mesosalpinx.  The uteroovarian pedicle was then clamped, cut, and double ligated first with a free tie of 0 Vicryl and suture ligature  of 0 Vicryl.  The left fallopian tube was identified. Mesosalpinx  was clamped and cut.  The tube was passed off the operative field.  Held pedicles secured with a free tie of 0 Vicryl.  At this point in time, the bladder flap was better developed.  The left uterine vessels were skeletonized,  clamped, cut, suture  ligated with 0 Vicryl.  We then went to the right side.  We elevated the ovary.  The endometrioma was drained.  Right round ligament was suture ligated with 0 Vicryl and excised.  A hole was made in the avascular area of mesosalpinx thereby isolating the  ovary.  The right uteroovarian pedicle was clamped, cut, and doubly ligated first with a free tie of 0 Vicryl, then a suture ligature of 0 Vicryl.  The bladder flap was further developed.  The uterine vessels were skeletonized on the left side, clamped,  cut and suture ligated with 0 Vicryl.  The adhesive processes were taken down in the cul-de-sac using blunt and sharp dissection.  Bladder flap was fully developed.  Using the clamp, cut, tie technique with suture ligatures of 0 Vicryl, the parametrium  was serially separated from the sides of the uterus.  The vaginal angles were clamped and cut.  Intervening vaginal mucosa was excised.  The uterus and cervix were passed off the operative field.  The right fallopian tube was identified.  The mesosalpinx  was clamped and cut and the tube was passed off the operative field.  Held pedicles secured with a free tie of 0 Vicryl.  At this point in time, vaginal cuff was closed with interrupted figure-of-eights of 0 Vicryl.  Bleeders brought under control with  clamps and suture ligatures.  We also used the cautery.  At this point in time, we thoroughly irrigated the pelvis.  We had decent hemostasis.  The right ovary was hemostatically intact.  We went ahead and secured it to the right round ligament.  The  left ovary was densely adherent to the pelvic sidewall.  At this point in time, urine output was clear.  We did insert Arista to prevent further bleeding.  All packs were removed.  The self-retaining retractor was removed.  Peritoneum was closed with  running suture of 0 Vicryl.  Fascia closed with a running suture of 0 PDS.  Skin was closed with a running subcuticular suture of 4-0 Vicryl  and Dermabond.  We had good hemostasis.  At this point in time, cystoscopy was performed.  The bladder was intact.  No evidence of injury.  Both ureteral orifices were identified and noted to be spilling copious amount of clear urine.  Cystoscope was removed.  Foley was replaced.  The patient  was taken out of dorsal lithotomy position. Once alert and extubated, transferred to recovery room in good condition.  Sponge, instrument and needle count was correct by circulating nurse.  Urine output was clear.  Total deficit of blood was probably  100-200 mL.   NIK D: 11/26/2021 1:15:31 pm T: 11/26/2021 10:29:00 pm  JOB: [id number]/ [id number]

## 2021-11-26 NOTE — Transfer of Care (Signed)
Immediate Anesthesia Transfer of Care Note  Patient: Rachel Ryan  Procedure(s) Performed: ATTEMPTED LAPAROSCOPIC ASSISTED VAGINAL HYSTERECTOMY, possible removal of ovaries (Abdomen) HYSTERECTOMY ABDOMINAL, BILATERAL SALPINGECTOMY (Abdomen) CYSTOSCOPY (Urethra)  Patient Location: PACU  Anesthesia Type:General  Level of Consciousness: drowsy and patient cooperative  Airway & Oxygen Therapy: Patient Spontanous Breathing  Post-op Assessment: Report given to RN and Post -op Vital signs reviewed and stable  Post vital signs: Reviewed and stable  Last Vitals:  Vitals Value Taken Time  BP 124/89 11/26/21 1015  Temp 37.1 C 11/26/21 1010  Pulse 76 11/26/21 1018  Resp 10 11/26/21 1018  SpO2 97 % 11/26/21 1018  Vitals shown include unvalidated device data.  Last Pain: There were no vitals filed for this visit.       Complications: No notable events documented.

## 2021-11-26 NOTE — Anesthesia Postprocedure Evaluation (Signed)
Anesthesia Post Note  Patient: Rachel Ryan  Procedure(s) Performed: ATTEMPTED LAPAROSCOPIC ASSISTED VAGINAL HYSTERECTOMY, possible removal of ovaries (Abdomen) HYSTERECTOMY ABDOMINAL, BILATERAL SALPINGECTOMY (Abdomen) CYSTOSCOPY (Urethra)     Patient location during evaluation: PACU Anesthesia Type: General Level of consciousness: awake and alert Pain management: pain level controlled Vital Signs Assessment: post-procedure vital signs reviewed and stable Respiratory status: spontaneous breathing, nonlabored ventilation, respiratory function stable and patient connected to nasal cannula oxygen Cardiovascular status: blood pressure returned to baseline and stable Postop Assessment: no apparent nausea or vomiting Anesthetic complications: no   No notable events documented.  Last Vitals:  Vitals:   11/26/21 1045 11/26/21 1100  BP: 125/76 119/81  Pulse: 74 74  Resp: 13 12  Temp: (!) 36.4 C   SpO2: 99% 97%    Last Pain:  Vitals:   11/26/21 1057  PainSc: 5                  Amaris Delafuente S

## 2021-11-26 NOTE — H&P (Signed)
  History and physical exam unchanged 

## 2021-11-26 NOTE — Anesthesia Procedure Notes (Signed)
Procedure Name: Intubation Date/Time: 11/26/2021 7:33 AM Performed by: Gwyndolyn Saxon, CRNA Pre-anesthesia Checklist: Patient identified, Emergency Drugs available, Suction available and Patient being monitored Patient Re-evaluated:Patient Re-evaluated prior to induction Oxygen Delivery Method: Circle system utilized Preoxygenation: Pre-oxygenation with 100% oxygen Induction Type: IV induction Ventilation: Mask ventilation without difficulty Laryngoscope Size: Mac and 3 Grade View: Grade III Tube type: Oral Tube size: 7.0 mm Number of attempts: 2 Airway Equipment and Method: Patient positioned with wedge pillow and Stylet Placement Confirmation: positive ETCO2 and breath sounds checked- equal and bilateral Secured at: 21 cm Tube secured with: Tape Dental Injury: Teeth and Oropharynx as per pre-operative assessment  Difficulty Due To: Difficulty was anticipated and Difficult Airway- due to anterior larynx Comments: 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 DL#1 with miller 2; no view. DL#2 with MAC 3; grade 3 view with cricoid

## 2021-11-26 NOTE — Anesthesia Preprocedure Evaluation (Signed)
Anesthesia Evaluation  Patient identified by MRN, date of birth, ID band Patient awake    Reviewed: Allergy & Precautions, NPO status , Patient's Chart, lab work & pertinent test results  History of Anesthesia Complications (+) PONV and history of anesthetic complications  Airway Mallampati: II  TM Distance: >3 FB Neck ROM: Full    Dental no notable dental hx.    Pulmonary neg pulmonary ROS,    Pulmonary exam normal breath sounds clear to auscultation       Cardiovascular negative cardio ROS Normal cardiovascular exam Rhythm:Regular Rate:Normal     Neuro/Psych negative neurological ROS  negative psych ROS   GI/Hepatic Neg liver ROS,   Endo/Other  Hypothyroidism   Renal/GU negative Renal ROS  negative genitourinary   Musculoskeletal negative musculoskeletal ROS (+)   Abdominal   Peds negative pediatric ROS (+)  Hematology negative hematology ROS (+)   Anesthesia Other Findings   Reproductive/Obstetrics negative OB ROS                             Anesthesia Physical Anesthesia Plan  ASA: 2  Anesthesia Plan: General   Post-op Pain Management: Dilaudid IV   Induction: Intravenous  PONV Risk Score and Plan: 4 or greater and Ondansetron, Dexamethasone, Midazolam, Scopolamine patch - Pre-op, Droperidol and Treatment may vary due to age or medical condition  Airway Management Planned: Oral ETT  Additional Equipment:   Intra-op Plan:   Post-operative Plan: Extubation in OR  Informed Consent: I have reviewed the patients History and Physical, chart, labs and discussed the procedure including the risks, benefits and alternatives for the proposed anesthesia with the patient or authorized representative who has indicated his/her understanding and acceptance.     Dental advisory given  Plan Discussed with: CRNA and Surgeon  Anesthesia Plan Comments:         Anesthesia  Quick Evaluation

## 2021-11-27 ENCOUNTER — Encounter (HOSPITAL_BASED_OUTPATIENT_CLINIC_OR_DEPARTMENT_OTHER): Payer: Self-pay | Admitting: Obstetrics and Gynecology

## 2021-11-27 DIAGNOSIS — D259 Leiomyoma of uterus, unspecified: Secondary | ICD-10-CM | POA: Diagnosis not present

## 2021-11-27 DIAGNOSIS — N838 Other noninflammatory disorders of ovary, fallopian tube and broad ligament: Secondary | ICD-10-CM | POA: Diagnosis not present

## 2021-11-27 DIAGNOSIS — N939 Abnormal uterine and vaginal bleeding, unspecified: Secondary | ICD-10-CM | POA: Diagnosis not present

## 2021-11-27 DIAGNOSIS — N8000 Endometriosis of the uterus, unspecified: Secondary | ICD-10-CM | POA: Diagnosis not present

## 2021-11-27 LAB — CBC
HCT: 33 % — ABNORMAL LOW (ref 36.0–46.0)
Hemoglobin: 11 g/dL — ABNORMAL LOW (ref 12.0–15.0)
MCH: 33.4 pg (ref 26.0–34.0)
MCHC: 33.3 g/dL (ref 30.0–36.0)
MCV: 100.3 fL — ABNORMAL HIGH (ref 80.0–100.0)
Platelets: 277 10*3/uL (ref 150–400)
RBC: 3.29 MIL/uL — ABNORMAL LOW (ref 3.87–5.11)
RDW: 12.8 % (ref 11.5–15.5)
WBC: 12.5 10*3/uL — ABNORMAL HIGH (ref 4.0–10.5)
nRBC: 0 % (ref 0.0–0.2)

## 2021-11-27 LAB — SURGICAL PATHOLOGY

## 2021-11-27 MED ORDER — OXYCODONE-ACETAMINOPHEN 5-325 MG PO TABS
ORAL_TABLET | ORAL | Status: AC
  Filled 2021-11-27: qty 1

## 2021-11-27 MED ORDER — HYDROMORPHONE HCL 1 MG/ML IJ SOLN
INTRAMUSCULAR | Status: AC
  Filled 2021-11-27: qty 1

## 2021-11-27 NOTE — Progress Notes (Signed)
1 Day Post-Op Procedure(s) (LRB): ATTEMPTED LAPAROSCOPIC ASSISTED VAGINAL HYSTERECTOMY, possible removal of ovaries (N/A) HYSTERECTOMY ABDOMINAL, BILATERAL SALPINGECTOMY (N/A) CYSTOSCOPY  Subjective: Patient reports tolerating PO, + flatus, and no problems voiding.    Objective: I have reviewed patient's vital signs, medications, and labs.  GI: soft, non-tender; bowel sounds normal; no masses,  no organomegaly Vaginal Bleeding: minimal  Assessment: s/p Procedure(s): ATTEMPTED LAPAROSCOPIC ASSISTED VAGINAL HYSTERECTOMY, possible removal of ovaries (N/A) HYSTERECTOMY ABDOMINAL, BILATERAL SALPINGECTOMY (N/A) CYSTOSCOPY: stable  Plan: Discharge home  LOS: 0 days    Sorcha Rotunno 11/27/2021, 9:14 AM

## 2021-11-27 NOTE — Discharge Summary (Signed)
NAME: Rachel Ryan, Rachel Ryan MEDICAL RECORD NO: [id number] ACCOUNT NO: [id number] DATE OF BIRTH: Mar 06, 1976 FACILITY: [facility] LOCATION: [facility] PHYSICIAN: Darlyn Chamber, MD  Discharge Summary   DATE OF DISCHARGE: 11/27/2021  ADMISSION DIAGNOSES:  Abnormal uterine bleeding and chronic pelvic pain secondary to pelvic endometriosis.  DISCHARGE DIAGNOSES:  Abnormal uterine bleeding and chronic pelvic pain secondary to pelvic endometriosis with addition of extensive pelvic adhesions.  OPERATIVE PROCEDURE: Diagnostic laparoscopy.  Subsequent exploratory surgery with total abdominal hysterectomy, lysis of adhesions, removal of both fallopian tubes and cystoscopy.  For complete history and physical, please see dictated note.  COURSE IN THE HOSPITAL:  The patient underwent above noted surgery.  Extensive adhesions were noted; therefore, exploratory surgery was undertaken for management.  Both tubes were removed and irrigation to the uterus.  Did have an endometrioma of the  right ovary that was drained.  The left ovary was densely adherent to the left pelvic sidewall that was still left in place per patient's request.  We did perform cystoscopy, which was completely clear.  Postoperatively, the patient did relatively well.   She did have some nausea and vomiting during the night that resolved. By the next morning, she was tolerating her diet.  She was voiding without difficulty.  Ambulating without difficulty.  Tolerating her diet.  PHYSICAL EXAMINATION:  Her abdomen was soft.  Incision was clear.  She had no extensive vaginal bleeding.  Hemoglobin was 11.  In terms of complications, none were encountered during her stay in the hospital.  The patient is discharged home in stable condition.  DISPOSITION:  The patient to avoid heavy lifting, vaginal entrance or driving of a car.  She is instructed to call should there be signs of heavy vaginal bleeding.  Fever should be reported.  Nausea, vomiting  should be reported.  Excessive abdominal pain  and tenderness.  Discussed signs and symptoms of deep venous thrombosis and pulmonary embolus.  Discharged home on Phenergan suppositories for nausea and Percocet as needed for pain.  The patient will be followed up on Monday in the office at 2:00.   PUS D: 11/27/2021 9:42:32 am T: 11/27/2021 2:06:00 pm  JOB: [id number]/ [id number]

## 2021-11-27 NOTE — Discharge Summary (Signed)
Patient name Rachel Ryan, Rachel Ryan DICTATION# [id number] CSN# [id number]  Arvella Nigh, MD 11/27/2021 9:43 AM

## 2021-12-23 DIAGNOSIS — Z09 Encounter for follow-up examination after completed treatment for conditions other than malignant neoplasm: Secondary | ICD-10-CM | POA: Diagnosis not present

## 2021-12-31 ENCOUNTER — Other Ambulatory Visit: Payer: Self-pay | Admitting: Psychiatry

## 2021-12-31 DIAGNOSIS — F4001 Agoraphobia with panic disorder: Secondary | ICD-10-CM

## 2021-12-31 DIAGNOSIS — F411 Generalized anxiety disorder: Secondary | ICD-10-CM

## 2022-02-16 ENCOUNTER — Other Ambulatory Visit: Payer: Self-pay | Admitting: Psychiatry

## 2022-02-16 DIAGNOSIS — F411 Generalized anxiety disorder: Secondary | ICD-10-CM

## 2022-02-16 DIAGNOSIS — F4001 Agoraphobia with panic disorder: Secondary | ICD-10-CM

## 2022-02-25 ENCOUNTER — Encounter: Payer: Self-pay | Admitting: Psychiatry

## 2022-02-25 ENCOUNTER — Ambulatory Visit (INDEPENDENT_AMBULATORY_CARE_PROVIDER_SITE_OTHER): Payer: BC Managed Care – PPO | Admitting: Psychiatry

## 2022-02-25 DIAGNOSIS — F331 Major depressive disorder, recurrent, moderate: Secondary | ICD-10-CM

## 2022-02-25 DIAGNOSIS — F4001 Agoraphobia with panic disorder: Secondary | ICD-10-CM | POA: Diagnosis not present

## 2022-02-25 DIAGNOSIS — F411 Generalized anxiety disorder: Secondary | ICD-10-CM | POA: Diagnosis not present

## 2022-02-25 DIAGNOSIS — G43009 Migraine without aura, not intractable, without status migrainosus: Secondary | ICD-10-CM | POA: Diagnosis not present

## 2022-02-25 MED ORDER — BUPROPION HCL ER (XL) 150 MG PO TB24: ORAL_TABLET | ORAL | 0 refills | Status: DC

## 2022-02-25 NOTE — Progress Notes (Signed)
Rachel Ryan [id number] 1976/01/04 46 y.o.  Subjective:   Patient ID:  Rachel Ryan is a 46 y.o. (DOB [birth year]) female.  Chief Complaint:  Chief Complaint  Patient presents with   Follow-up   Anxiety    Anxiety Symptoms include nervous/anxious behavior. Patient reports no chest pain, confusion, decreased concentration, palpitations, shortness of breath or suicidal ideas.    Rachel Ryan presents to the office today for follow-up of anxiety and migraine.  When seen seen in October.  She was under a lot of stress and asking for more Xanax.  Since then she got a 90-day supply of Xanax and it appears she exceeded the dosage recommended taking up to 6 daily.  She then sought an early refill.  She denies this.  She denies exceeding 3 daily average.  She says that [facility] is refilling the wrong dose of 180 ct when it should be #270/90 days for 3 daily.   visit September 2020.  She was doing relatively well emotionally but was concerned about her weight.  Therefore we made the following decision: No med changes today except OK gradual reduction in Lexapro towards a target of 20 mg daily to see if weight gain will be improved.    12/22/19 appt noted: Went down to 20 mg Lexapro for a month but very irritable and angry so increased it back to 30 mg and it's better but not gone.  No energy, don't want to do anything and is irritable.  Situational depression.  Main stress H staying at home all the time.  Daycare and school out a lot so everyone all together all the time and hard on her if doesn't get alone time.  H works from home. Adopted D just started back in person pre-K.  Worries lockdown hurting the kids and her marriage. Average worse up to 14-15  HA monthly last couple of months. Adoption finally done after 3 years.  H been home working since March, not good.  Gets on her nerves. Plan: option Abilify potentiation  07/10/20 appt noted: CO difficulty getting  Xanax refill on time.   Overall about the same with no energy and no desire to do anything.  Stress H working from home but not really working very much.  She wants him to go back to work.   Stress with lockdown.  Never alone now. Not depressed.   HA frequency is about the same.   Pt reports that mood is Anxious and describes anxiety as Moderate. Anxiety symptoms include: Excessive Worry,. Pt reports no sleep issues and Unless H interferes with snoring. Pt reports that appetite is good. Pt reports that energy is poor and no change. Concentration is down slightly. Suicidal thoughts:  denied by patient. Plan: No med changes  04/06/2021 phone call complaining of weight gain apparently from Lexapro.  She was given instructions to gradually reduce from 30 mg to 20 mg and was encouraged to make an earlier appointment so that we could change perhaps to venlafaxine.  05/27/2021 appointment with the following noted: Tried lower dose Lexapro to 20 mg but too irritable and too anxious to manage.  Gained 25# since on Lexapro and didn't lose weight while on 20 mg Lexapro.   Adopted D. And has grown kids. A little panic and anxiety. Propranolol reduced HA from 15/mo to 8/mo. Plan: DT wt gain with Lexapro, Reduce Lexapro to 20 mg daily and start fluoxetine 20 mg daily for 1 week, Then increase fluoxetine to 2  of the 20 mg capsules and reduce Lexapro to 10 mg daily for 1 week. Then increase fluoxetine to 3 capsules daily and stop Lexapro  08/27/2021 appointment with the following noted: Got Covid in Oct mild. Waited until mid Sept to change and I've been fine and lost 4#.  No more anxious than before. Feels a little more giggly and more like herself.   Taking Xanax TID but may be 1/2 in AM.  No panic attacks. Adopted D has been sick a lot lately and hospitalized last week.  46 yo.  Better now.  She was traumatized from the hospital stay with all the needles.   Propanolol for HA..  had a lot of HA during D's 2  week illness. No SE problems. Not depressed.   Plan: Continue fluoxetine 60 mg daily and Xanax 1 mg 3 times daily  02/25/2022 appointment with the following noted: Hysterectomy Feb and no migraine and  limited Xanax and better mood while on opioids in the month.  Percocet for the month.  More motivated and interested and energy. H says she was like a different person that month. Almost impossible to get anything done bc no energy, not interested in anything but TV.  Not sad.  Unproductive. Anxiety unchanged from last visit and chronic.  Imitrex makes HA stop.  Frequency mainly menstrual total 6-7 monthly.  Past Psychiatric Medication Trials:  Doxazosin,  Fluoxetine partial response, sertraline colitis, lexapro 30 SE wt gain,  buspirone   Abilify 5 for couple mos insomnia, helped energy but wt gain Percocet helped mood Dilaudid panic  Review of Systems:  Review of Systems  Constitutional:  Negative for fatigue.  Respiratory:  Negative for shortness of breath.   Cardiovascular:  Negative for chest pain and palpitations.  Neurological:  Positive for headaches. Negative for tremors and weakness.  Psychiatric/Behavioral:  Positive for dysphoric mood. Negative for agitation, behavioral problems, confusion, decreased concentration, hallucinations, self-injury, sleep disturbance and suicidal ideas. The patient is nervous/anxious. The patient is not hyperactive.    Medications: I have reviewed the patient's current medications.  Current Outpatient Medications  Medication Sig Dispense Refill   ALPRAZolam (XANAX) 1 MG tablet TAKE 1 TABLET(1 MG) BY MOUTH THREE TIMES DAILY AS NEEDED FOR ANXIETY 270 tablet 0   ASHWAGANDHA PO Take by mouth.     Aspirin-Acetaminophen-Caffeine (EXCEDRIN PO) Take 2 tablets by mouth every 8 (eight) hours as needed. headache      co-enzyme Q-10 30 MG capsule Take 30 mg by mouth 3 (three) times daily.     CRANBERRY CONCENTRATE PO Take by mouth. 8400 mg      diphenhydrAMINE (BENADRYL) 25 mg capsule Take 25 mg by mouth at bedtime as needed. sleep      FLUoxetine (PROZAC) 20 MG capsule TAKE 3 CAPSULES DAILY 629 capsule 0   Garlic 4765 MG CAPS Take by mouth.     Misc Natural Products (GLUCOSAMINE CHOND COMPLEX/MSM PO) Take by mouth.     Multiple Vitamin (MULTIVITAMIN) tablet Take 1 tablet by mouth daily.       Omega-3 Fatty Acids (FISH OIL CONCENTRATE PO) Take by mouth.     propranolol (INDERAL) 20 MG tablet Take 30 mg by mouth at bedtime.     SUMAtriptan (IMITREX) 100 MG tablet May repeat in 2 hours if headache persists or recurs. 27 tablet 8   Turmeric (QC TUMERIC COMPLEX PO) Take by mouth.     UNABLE TO FIND daily. Med Name: Zinc     vitamin C (  ASCORBIC ACID) 500 MG tablet Take 500 mg by mouth daily.     Vitamin D, Ergocalciferol, (DRISDOL) 1.25 MG (50000 UNIT) CAPS capsule Take 50,000 Units by mouth every 7 (seven) days. Includes in Vitamin K     No current facility-administered medications for this visit.    Medication Side Effects: None ?sweating  Allergies:  Allergies  Allergen Reactions   Levofloxacin     Severe headache   Sulfa Antibiotics     dizziness    Past Medical History:  Diagnosis Date   Anxiety    generalized anxiety disorder   Arthritis    fingers   Breast mass    2 lumps lt breast unknown time    COVID 07/2021   mild symptoms, sore throat, achiness, fever   Depressed    Endometriosis 2015   Family history of breast cancer    Family history of colon cancer    GERD (gastroesophageal reflux disease)    occasional   Hypothyroidism    TSH slightly high per pt, she is taking supplements to help 11/18/21   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   Wears glasses     Family History  Problem  Relation Age of Onset   Colon polyps Mother        cancerous   Colon cancer Mother 17   Breast cancer Paternal Aunt 45   Breast cancer Paternal Grandfather 80   Breast cancer Paternal Aunt 95       Genetic testing - MUTYH mutation   Melanoma Maternal Uncle        dx in his 24s   Breast cancer Other        MGMs sister dx in her 13s-30s    Social History   Socioeconomic History   Marital status: Married    Spouse name: Not on file   Number of children: 1   Years of education: Not on file   Highest education level: Not on file  Occupational History   Occupation: unemployed  Tobacco Use   Smoking status: Never   Smokeless tobacco: Never  Vaping Use   Vaping Use: Never used  Substance and Sexual Activity   Alcohol use: No   Drug use: No   Sexual activity: Yes    Birth control/protection: None  Other Topics Concern   Not on file  Social History Narrative   Not on file   Social Determinants of Health   Financial Resource Strain: Not on file  Food Insecurity: Not on file  Transportation Needs: Not on file  Physical Activity: Not on file  Stress: Not on file  Social Connections: Not on file  Intimate Partner Violence: Not on file    Past Medical History, Surgical history, Social history, and Family history were reviewed and updated as appropriate.   Please see review of systems for further details on the patient's review from today.   Objective:   Physical Exam:  There were no vitals taken for this visit.  Physical Exam Constitutional:      General: She is not in acute distress.    Appearance: She is well-developed.  Musculoskeletal:        General: No deformity.  Neurological:     Mental Status: She is alert  and oriented to person, place, and time.     Coordination: Coordination normal.  Psychiatric:        Attention and Perception: Attention and perception normal. She does not perceive auditory or visual hallucinations.        Mood and Affect: Mood is  anxious and depressed. Affect is not labile, blunt, angry or inappropriate.        Speech: Speech normal. Speech is not slurred.        Behavior: Behavior normal.        Thought Content: Thought content normal. Thought content is not delusional. Thought content does not include homicidal or suicidal ideation. Thought content does not include suicidal plan.        Cognition and Memory: Cognition and memory normal.        Judgment: Judgment normal.     Comments: Insight intact. No delusions.  Mild irritable.    Lab Review:     Component Value Date/Time   NA 135 11/23/2021 1201   K 4.0 11/23/2021 1201   CL 104 11/23/2021 1201   CO2 24 11/23/2021 1201   GLUCOSE 98 11/23/2021 1201   BUN 14 11/23/2021 1201   CREATININE 0.71 11/23/2021 1201   CALCIUM 9.2 11/23/2021 1201   GFRNONAA >60 11/23/2021 1201   GFRAA >90 03/02/2014 0120       Component Value Date/Time   WBC 12.5 (H) 11/27/2021 0051   RBC 3.29 (L) 11/27/2021 0051   HGB 11.0 (L) 11/27/2021 0051   HCT 33.0 (L) 11/27/2021 0051   PLT 277 11/27/2021 0051   MCV 100.3 (H) 11/27/2021 0051   MCH 33.4 11/27/2021 0051   MCHC 33.3 11/27/2021 0051   RDW 12.8 11/27/2021 0051   LYMPHSABS 1.0 03/02/2014 0120   MONOABS 0.6 03/02/2014 0120   EOSABS 0.0 03/02/2014 0120   BASOSABS 0.0 03/02/2014 0120    No results found for: POCLITH, LITHIUM   No results found for: PHENYTOIN, PHENOBARB, VALPROATE, CBMZ   .res Assessment: Plan:    Major depressive disorder, recurrent episode, moderate (HCC)  Generalized anxiety disorder  Panic disorder with agoraphobia  Migraine without aura and without status migrainosus, not intractable   Greater than 50% of 30-minute phone to phone time with patient was spent on counseling and coordination of care.  She does still have some panic attacks sometimes are triggered by migraine headaches and sometimes they are just generally stress related but they are not full panic they are typically limited  symptom panic attacks.  She does take the Xanax '1mg'$  TID  New dx major depression discussed.  Alternatives include Auvelity, Rexulti, pramipexole. DT anhedonia, low motivation, low energy trial Wellbutrin 150 to 300 mg AM Disc SE risk,  She asks about increasing Xanax.  We would prefer to avoid that.  She appears to have some degree of tolerance developing and this was discussed. Continue xanax 1 mg 1 tablet q 8 hours prn anxiety.  She agrees to the limit of 3 a day. She is taking 3 mg daily.  She denies SE.   She denies abusing any alprazolam.  We discussed the short-term risks associated with benzodiazepines including sedation and increased fall risk among others.  Discussed long-term side effect risk including dependence, potential withdrawal symptoms, and the potential eventual dose-related risk of dementia. Newer studies refute that.   Option switch to Effexor for help with less weight gain.  She prefers return to Prozac bc she knows it didn't make her hungry like the Lexapro.  Disc other alternatives like Trintellix but is less reliably effective for anxiety though it does have less weight gain risk.  Discussed side effects of each of these options in detail.   Disc SE in detail and SSRI withdrawal sx.  Continue fluoxetine 60 mg daily.  Disc with HA frequency she consider Aimovig, Ajovy etc. she is having more headaches but it is likely stress related.  Discussed these medications in detail.  She would likely need to meet criteria by failing a couple of other preventatives such as topiramate etc. before she would qualify.  We discussed other headache preventatives including venlafaxine.  She will consider these.  We are supplying the Imitrex as needed for headaches and they appear reasonably managed.  FU 1-2 mo  Lynder Parents, MD, DFAPA   Please see After Visit Summary for patient specific instructions.  No future appointments.  No orders of the defined types were placed in this  encounter.     -------------------------------

## 2022-03-19 ENCOUNTER — Other Ambulatory Visit: Payer: Self-pay | Admitting: Psychiatry

## 2022-03-19 DIAGNOSIS — F331 Major depressive disorder, recurrent, moderate: Secondary | ICD-10-CM

## 2022-03-31 ENCOUNTER — Other Ambulatory Visit: Payer: Self-pay | Admitting: Psychiatry

## 2022-03-31 DIAGNOSIS — F411 Generalized anxiety disorder: Secondary | ICD-10-CM

## 2022-03-31 DIAGNOSIS — F4001 Agoraphobia with panic disorder: Secondary | ICD-10-CM

## 2022-04-22 ENCOUNTER — Telehealth (INDEPENDENT_AMBULATORY_CARE_PROVIDER_SITE_OTHER): Payer: BC Managed Care – PPO | Admitting: Psychiatry

## 2022-04-22 ENCOUNTER — Encounter: Payer: Self-pay | Admitting: Psychiatry

## 2022-04-22 DIAGNOSIS — F411 Generalized anxiety disorder: Secondary | ICD-10-CM | POA: Diagnosis not present

## 2022-04-22 DIAGNOSIS — G43009 Migraine without aura, not intractable, without status migrainosus: Secondary | ICD-10-CM | POA: Diagnosis not present

## 2022-04-22 DIAGNOSIS — F331 Major depressive disorder, recurrent, moderate: Secondary | ICD-10-CM

## 2022-04-22 DIAGNOSIS — F4001 Agoraphobia with panic disorder: Secondary | ICD-10-CM

## 2022-04-22 DIAGNOSIS — Z1231 Encounter for screening mammogram for malignant neoplasm of breast: Secondary | ICD-10-CM | POA: Diagnosis not present

## 2022-04-22 MED ORDER — SUMATRIPTAN SUCCINATE 100 MG PO TABS: ORAL_TABLET | ORAL | 8 refills | Status: DC

## 2022-04-22 MED ORDER — ALPRAZOLAM 1 MG PO TABS: 1.0000 mg | ORAL_TABLET | Freq: Three times a day (TID) | ORAL | 1 refills | Status: DC

## 2022-04-22 MED ORDER — FLUOXETINE HCL 20 MG PO CAPS: 60.0000 mg | ORAL_CAPSULE | Freq: Every day | ORAL | 1 refills | Status: DC

## 2022-04-22 NOTE — Progress Notes (Signed)
Rachel Ryan [id number] 1976-05-13 46 y.o.  Video Visit via My Chart  I connected with pt by video using My Chart and verified that I am speaking with the correct person using two identifiers.   I discussed the limitations, risks, security and privacy concerns of performing an evaluation and management service by My Chart  and the availability of in person appointments. I also discussed with the patient that there may be a patient responsible charge related to this service. The patient expressed understanding and agreed to proceed.  I discussed the assessment and treatment plan with the patient. The patient was provided an opportunity to ask questions and all were answered. The patient agreed with the plan and demonstrated an understanding of the instructions.   The patient was advised to call back or seek an in-person evaluation if the symptoms worsen or if the condition fails to improve as anticipated.  I provided 30 minutes of video time during this encounter.  The patient was located at home and the provider was located office. Session from 230-300 pm.  Subjective:   Patient ID:  Rachel Ryan is a 46 y.o. (DOB 26-Mar-1976) female.  Chief Complaint:  Chief Complaint  Patient presents with   Follow-up   Depression   Anxiety    Anxiety Symptoms include nervous/anxious behavior. Patient reports no chest pain, confusion, decreased concentration, palpitations, shortness of breath or suicidal ideas.     Rachel Ryan presents to the office today for follow-up of anxiety and migraine.  When seen seen in October.  She was under a lot of stress and asking for more Xanax.  Since then she got a 90-day supply of Xanax and it appears she exceeded the dosage recommended taking up to 6 daily.  She then sought an early refill.  She denies this.  She denies exceeding 3 daily average.  She says that [facility] is refilling the wrong dose of 180 ct when it should be  #270/90 days for 3 daily.   visit September 2020.  She was doing relatively well emotionally but was concerned about her weight.  Therefore we made the following decision: No med changes today except OK gradual reduction in Lexapro towards a target of 20 mg daily to see if weight gain will be improved.    12/22/19 appt noted: Went down to 20 mg Lexapro for a month but very irritable and angry so increased it back to 30 mg and it's better but not gone.  No energy, don't want to do anything and is irritable.  Situational depression.  Main stress H staying at home all the time.  Daycare and school out a lot so everyone all together all the time and hard on her if doesn't get alone time.  H works from home. Adopted D just started back in person pre-K.  Worries lockdown hurting the kids and her marriage. Average worse up to 14-15  HA monthly last couple of months. Adoption finally done after 3 years.  H been home working since March, not good.  Gets on her nerves. Plan: option Abilify potentiation  07/10/20 appt noted: CO difficulty getting Xanax refill on time.   Overall about the same with no energy and no desire to do anything.  Stress H working from home but not really working very much.  She wants him to go back to work.   Stress with lockdown.  Never alone now. Not depressed.   HA frequency is about the same.  Pt reports that mood is Anxious and describes anxiety as Moderate. Anxiety symptoms include: Excessive Worry,. Pt reports no sleep issues and Unless H interferes with snoring. Pt reports that appetite is good. Pt reports that energy is poor and no change. Concentration is down slightly. Suicidal thoughts:  denied by patient. Plan: No med changes  04/06/2021 phone call complaining of weight gain apparently from Lexapro.  She was given instructions to gradually reduce from 30 mg to 20 mg and was encouraged to make an earlier appointment so that we could change perhaps to  venlafaxine.  05/27/2021 appointment with the following noted: Tried lower dose Lexapro to 20 mg but too irritable and too anxious to manage.  Gained 25# since on Lexapro and didn't lose weight while on 20 mg Lexapro.   Adopted D. And has grown kids. A little panic and anxiety. Propranolol reduced HA from 15/mo to 8/mo. Plan: DT wt gain with Lexapro, Reduce Lexapro to 20 mg daily and start fluoxetine 20 mg daily for 1 week, Then increase fluoxetine to 2 of the 20 mg capsules and reduce Lexapro to 10 mg daily for 1 week. Then increase fluoxetine to 3 capsules daily and stop Lexapro  08/27/2021 appointment with the following noted: Got Covid in Oct mild. Waited until mid Sept to change and I've been fine and lost 4#.  No more anxious than before. Feels a little more giggly and more like herself.   Taking Xanax TID but may be 1/2 in AM.  No panic attacks. Adopted D has been sick a lot lately and hospitalized last week.  46 yo.  Better now.  She was traumatized from the hospital stay with all the needles.   Propanolol for HA..  had a lot of HA during D's 2 week illness. No SE problems. Not depressed.   Plan: Continue fluoxetine 60 mg daily and Xanax 1 mg 3 times daily  02/25/2022 appointment with the following noted: Hysterectomy Feb and no migraine and  limited Xanax and better mood while on opioids in the month.  Percocet for the month.  More motivated and interested and energy. H says she was like a different person that month. Almost impossible to get anything done bc no energy, not interested in anything but TV.  Not sad.  Unproductive. Anxiety unchanged from last visit and chronic.  Imitrex makes HA stop.  Frequency mainly menstrual total 6-7 monthly. Plan: DT anhedonia, low motivation, low energy trial Wellbutrin 150 to 300 mg AM  04/22/22 appt noted: Took Wellbutrin for a month and it caused HA and anxiety and stopped it.  Still has the HA.  Hx HA but were not daily. Propranolol helped  reduce severity of HA. Mood is a little better. Switched to [employer] and it helps her feel better, calmer. Subtle. Manages panic well and can talk herself down when they start coming.   Taking xanax 1 mg TID without SE.  Not sleepy from it now. Not much desire to go out.  Tends to have more HA if goes out of the house. Continues fluoxetine 60 mg daily and without SE A lot of depression is situational and she doesn't want med changes. Sleep ok with Xanax and Benadryl. Hysterectomy reduced abd pain. Terrified of topiramate.  Past Psychiatric Medication Trials:  Doxazosin,   Fluoxetine partial response, sertraline colitis, lexapro 30 SE wt gain,  buspirone   Abilify 5 for couple mos insomnia, helped energy but wt gain  Percocet helped mood Dilaudid panic  Review of Systems:  Review of Systems  Constitutional:  Negative for fatigue.  Respiratory:  Negative for shortness of breath.   Cardiovascular:  Negative for chest pain and palpitations.  Neurological:  Positive for headaches. Negative for tremors.  Psychiatric/Behavioral:  Negative for agitation, behavioral problems, confusion, decreased concentration, dysphoric mood, hallucinations, self-injury, sleep disturbance and suicidal ideas. The patient is nervous/anxious. The patient is not hyperactive.     Medications: I have reviewed the patient's current medications.  Current Outpatient Medications  Medication Sig Dispense Refill   ASHWAGANDHA PO Take by mouth.     Aspirin-Acetaminophen-Caffeine (EXCEDRIN PO) Take 2 tablets by mouth every 8 (eight) hours as needed. headache      co-enzyme Q-10 30 MG capsule Take 30 mg by mouth 3 (three) times daily.     CRANBERRY CONCENTRATE PO Take by mouth. 8400 mg     diphenhydrAMINE (BENADRYL) 25 mg capsule Take 25 mg by mouth at bedtime as needed. sleep      Garlic 5093 MG CAPS Take by mouth.     Misc Natural Products (GLUCOSAMINE CHOND COMPLEX/MSM PO) Take by mouth.     Multiple  Vitamin (MULTIVITAMIN) tablet Take 1 tablet by mouth daily.       Omega-3 Fatty Acids (FISH OIL CONCENTRATE PO) Take by mouth.     propranolol (INDERAL) 20 MG tablet Take 30 mg by mouth at bedtime.     Turmeric (QC TUMERIC COMPLEX PO) Take by mouth.     UNABLE TO FIND daily. Med Name: Zinc     vitamin C (ASCORBIC ACID) 500 MG tablet Take 500 mg by mouth daily.     Vitamin D, Ergocalciferol, (DRISDOL) 1.25 MG (50000 UNIT) CAPS capsule Take 50,000 Units by mouth every 7 (seven) days. Includes in Vitamin K     ALPRAZolam (XANAX) 1 MG tablet Take 1 tablet (1 mg total) by mouth 3 (three) times daily. 270 tablet 1   FLUoxetine (PROZAC) 20 MG capsule Take 3 capsules (60 mg total) by mouth daily. 270 capsule 1   SUMAtriptan (IMITREX) 100 MG tablet May repeat in 2 hours if headache persists or recurs. 27 tablet 8   No current facility-administered medications for this visit.    Medication Side Effects: None ?sweating  Allergies:  Allergies  Allergen Reactions   Levofloxacin     Severe headache   Sulfa Antibiotics     dizziness    Past Medical History:  Diagnosis Date   Anxiety    generalized anxiety disorder   Arthritis    fingers   Breast mass    2 lumps lt breast unknown time    COVID 07/2021   mild symptoms, sore throat, achiness, fever   Depressed    Endometriosis 2015   Family history of breast cancer    Family history of colon cancer    GERD (gastroesophageal reflux disease)    occasional   Hypothyroidism    TSH slightly high per pt, she is taking supplements to help 11/18/21   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   Wears glasses     Family History  Problem Relation Age of Onset   Colon polyps Mother        cancerous   Colon cancer Mother 70  Breast cancer Paternal Aunt 66   Breast cancer Paternal Grandfather 6   Breast cancer Paternal Aunt 5       Genetic testing - MUTYH mutation   Melanoma Maternal Uncle        dx in his 49s   Breast cancer Other        MGMs sister dx in her 82s-30s    Social History   Socioeconomic History   Marital status: Married    Spouse name: Not on file   Number of children: 1   Years of education: Not on file   Highest education level: Not on file  Occupational History   Occupation: unemployed  Tobacco Use   Smoking status: Never   Smokeless tobacco: Never  Vaping Use   Vaping Use: Never used  Substance and Sexual Activity   Alcohol use: No   Drug use: No   Sexual activity: Yes    Birth control/protection: None  Other Topics Concern   Not on file  Social History Narrative   Not on file   Social Determinants of Health   Financial Resource Strain: Not on file  Food Insecurity: Not on file  Transportation Needs: Not on file  Physical Activity: Not on file  Stress: Not on file  Social Connections: Not on file  Intimate Partner Violence: Not on file    Past Medical History, Surgical history, Social history, and Family history were reviewed and updated as appropriate.   Please see review of systems for further details on the patient's review from today.   Objective:   Physical Exam:  There were no vitals taken for this visit.  Physical Exam Neurological:     Mental Status: She is alert and oriented to person, place, and time.     Cranial Nerves: No dysarthria.  Psychiatric:        Attention and Perception: Attention and perception normal.        Mood and Affect: Mood is anxious and depressed.        Speech: Speech normal.        Behavior: Behavior is cooperative.        Thought Content: Thought content normal. Thought content is not paranoid or delusional. Thought content does not include homicidal or suicidal ideation. Thought content does not include suicidal plan.         Cognition and Memory: Cognition and memory normal.        Judgment: Judgment normal.     Comments: Insight intact Residual anxiety and situational depression.     Lab Review:     Component Value Date/Time   NA 135 11/23/2021 1201   K 4.0 11/23/2021 1201   CL 104 11/23/2021 1201   CO2 24 11/23/2021 1201   GLUCOSE 98 11/23/2021 1201   BUN 14 11/23/2021 1201   CREATININE 0.71 11/23/2021 1201   CALCIUM 9.2 11/23/2021 1201   GFRNONAA >60 11/23/2021 1201   GFRAA >90 03/02/2014 0120       Component Value Date/Time   WBC 12.5 (H) 11/27/2021 0051   RBC 3.29 (L) 11/27/2021 0051   HGB 11.0 (L) 11/27/2021 0051   HCT 33.0 (L) 11/27/2021 0051   PLT 277 11/27/2021 0051   MCV 100.3 (H) 11/27/2021 0051   MCH 33.4 11/27/2021 0051   MCHC 33.3 11/27/2021 0051   RDW 12.8 11/27/2021 0051   LYMPHSABS 1.0 03/02/2014 0120   MONOABS 0.6 03/02/2014 0120   EOSABS 0.0 03/02/2014 0120   BASOSABS 0.0 03/02/2014 0120  No results found for: "POCLITH", "LITHIUM"   No results found for: "PHENYTOIN", "PHENOBARB", "VALPROATE", "CBMZ"   .res Assessment: Plan:    Major depressive disorder, recurrent episode, moderate (HCC)  Generalized anxiety disorder - Plan: ALPRAZolam (XANAX) 1 MG tablet, FLUoxetine (PROZAC) 20 MG capsule  Panic disorder with agoraphobia - Plan: ALPRAZolam (XANAX) 1 MG tablet, FLUoxetine (PROZAC) 20 MG capsule  Migraine without aura and without status migrainosus, not intractable - Plan: SUMAtriptan (IMITREX) 100 MG tablet   Greater than 50% of 30-minute video time with patient was spent on counseling and coordination of care.  She does still have some panic attacks sometimes are triggered by migraine headaches and sometimes they are just generally stress related but they are not full panic they are typically limited symptom panic attacks.  She does take the Xanax '1mg'$  TID   She agrees to the limit of 3 a day. She is taking 3 mg daily.  She denies SE.   She denies abusing  any alprazolam.  We discussed the short-term risks associated with benzodiazepines including sedation and increased fall risk among others.  Discussed long-term side effect risk including dependence, potential withdrawal symptoms, and the potential eventual dose-related risk of dementia. Newer studies refute that.   Can't take Wellbutrin DT SE  Option switch to Effexor for help with less weight gain.  She prefers return to Prozac bc she knows it didn't make her hungry like the Lexapro.  Disc other alternatives like Trintellix but is less reliably effective for anxiety though it does have less weight gain risk.  Discussed side effects of each of these options in detail.   Disc SE in detail and SSRI withdrawal sx.  Continue fluoxetine 60 mg daily.  Disc with HA frequency she consider Aimovig, Ajovy etc. she is having more headaches but it is likely stress related.  Discussed these medications in detail.  She would likely need to meet criteria by failing a couple of other preventatives such as topiramate etc. before she would qualify.  We discussed other headache preventatives including venlafaxine.  She will consider these.  We are supplying the Imitrex as needed for headaches and they appear reasonably managed.  She is satisfied with current medications  FU 6 mo  Lynder Parents, MD, DFAPA   Please see After Visit Summary for patient specific instructions.  No future appointments.  No orders of the defined types were placed in this encounter.     -------------------------------

## 2022-05-05 ENCOUNTER — Other Ambulatory Visit: Payer: Self-pay | Admitting: Obstetrics and Gynecology

## 2022-05-05 DIAGNOSIS — Z803 Family history of malignant neoplasm of breast: Secondary | ICD-10-CM

## 2022-05-20 ENCOUNTER — Ambulatory Visit
Admission: RE | Admit: 2022-05-20 | Discharge: 2022-05-20 | Disposition: A | Discharge status: Home / Self Care | Payer: BC Managed Care – PPO | Source: Ambulatory Visit | Attending: Obstetrics and Gynecology | Admitting: Obstetrics and Gynecology

## 2022-05-20 DIAGNOSIS — N6310 Unspecified lump in the right breast, unspecified quadrant: Secondary | ICD-10-CM | POA: Diagnosis not present

## 2022-05-20 DIAGNOSIS — N6320 Unspecified lump in the left breast, unspecified quadrant: Secondary | ICD-10-CM | POA: Diagnosis not present

## 2022-05-20 DIAGNOSIS — Z803 Family history of malignant neoplasm of breast: Secondary | ICD-10-CM | POA: Diagnosis not present

## 2022-05-20 DIAGNOSIS — Z1239 Encounter for other screening for malignant neoplasm of breast: Secondary | ICD-10-CM | POA: Diagnosis not present

## 2022-05-20 MED ORDER — GADOBUTROL 1 MMOL/ML IV SOLN
7.0000 mL | Freq: Once | INTRAVENOUS | Status: AC | PRN
  Administered 2022-05-20: 7 mL via INTRAVENOUS

## 2022-06-05 DIAGNOSIS — N390 Urinary tract infection, site not specified: Secondary | ICD-10-CM | POA: Diagnosis not present

## 2022-09-17 DIAGNOSIS — N390 Urinary tract infection, site not specified: Secondary | ICD-10-CM | POA: Diagnosis not present

## 2022-10-15 DIAGNOSIS — N390 Urinary tract infection, site not specified: Secondary | ICD-10-CM | POA: Diagnosis not present

## 2022-10-16 DIAGNOSIS — Z3202 Encounter for pregnancy test, result negative: Secondary | ICD-10-CM | POA: Diagnosis not present

## 2022-10-16 DIAGNOSIS — R300 Dysuria: Secondary | ICD-10-CM | POA: Diagnosis not present

## 2022-10-16 DIAGNOSIS — R110 Nausea: Secondary | ICD-10-CM | POA: Diagnosis not present

## 2022-10-25 ENCOUNTER — Encounter: Payer: Self-pay | Admitting: Psychiatry

## 2022-10-25 ENCOUNTER — Telehealth (INDEPENDENT_AMBULATORY_CARE_PROVIDER_SITE_OTHER): Payer: BC Managed Care – PPO | Admitting: Psychiatry

## 2022-10-25 DIAGNOSIS — F4001 Agoraphobia with panic disorder: Secondary | ICD-10-CM

## 2022-10-25 DIAGNOSIS — F331 Major depressive disorder, recurrent, moderate: Secondary | ICD-10-CM | POA: Diagnosis not present

## 2022-10-25 DIAGNOSIS — F411 Generalized anxiety disorder: Secondary | ICD-10-CM | POA: Diagnosis not present

## 2022-10-25 DIAGNOSIS — G43009 Migraine without aura, not intractable, without status migrainosus: Secondary | ICD-10-CM | POA: Diagnosis not present

## 2022-10-25 MED ORDER — FLUOXETINE HCL 20 MG PO CAPS: 60.0000 mg | ORAL_CAPSULE | Freq: Every day | ORAL | 1 refills | Status: DC

## 2022-10-25 MED ORDER — CYCLOBENZAPRINE HCL 10 MG PO TABS: 10.0000 mg | ORAL_TABLET | Freq: Three times a day (TID) | ORAL | 0 refills | Status: DC | PRN

## 2022-10-25 MED ORDER — PROPRANOLOL HCL 20 MG PO TABS: ORAL_TABLET | ORAL | 1 refills | Status: DC

## 2022-10-25 MED ORDER — ALPRAZOLAM 1 MG PO TABS: 1.0000 mg | ORAL_TABLET | Freq: Three times a day (TID) | ORAL | 1 refills | Status: DC

## 2022-10-25 NOTE — Progress Notes (Signed)
Rachel Ryan [id number] 1976/01/26 47 y.o.  Video Visit via My Chart  I connected with pt by video using My Chart and verified that I am speaking with the correct person using two identifiers.   I discussed the limitations, risks, security and privacy concerns of performing an evaluation and management service by My Chart  and the availability of in person appointments. I also discussed with the patient that there may be a patient responsible charge related to this service. The patient expressed understanding and agreed to proceed.  I discussed the assessment and treatment plan with the patient. The patient was provided an opportunity to ask questions and all were answered. The patient agreed with the plan and demonstrated an understanding of the instructions.   The patient was advised to call back or seek an in-person evaluation if the symptoms worsen or if the condition fails to improve as anticipated.  I provided 30 minutes of video time during this encounter.  The patient was located at home and the provider was located office. Session from 100-130 pm  Subjective:   Patient ID:  Rachel Ryan is a 47 y.o. (DOB Dec 27, 1975) female.  Chief Complaint:  Chief Complaint  Patient presents with   Follow-up   Depression   Anxiety    Anxiety Symptoms include nervous/anxious behavior. Patient reports no chest pain, confusion, decreased concentration, palpitations, shortness of breath or suicidal ideas.    Depression        Associated symptoms include headaches.  Associated symptoms include no decreased concentration, no fatigue and no suicidal ideas.  Rachel Ryan presents to the office today for follow-up of anxiety and migraine.  When seen seen in October.  She was under a lot of stress and asking for more Xanax.  Since then she got a 90-day supply of Xanax and it appears she exceeded the dosage recommended taking up to 6 daily.  She then sought an early refill.   She denies this.  She denies exceeding 3 daily average.  She says that [facility] is refilling the wrong dose of 180 ct when it should be #270/90 days for 3 daily.   visit September 2020.  She was doing relatively well emotionally but was concerned about her weight.  Therefore we made the following decision: No med changes today except OK gradual reduction in Lexapro towards a target of 20 mg daily to see if weight gain will be improved.    12/22/19 appt noted: Went down to 20 mg Lexapro for a month but very irritable and angry so increased it back to 30 mg and it's better but not gone.  No energy, don't want to do anything and is irritable.  Situational depression.  Main stress H staying at home all the time.  Daycare and school out a lot so everyone all together all the time and hard on her if doesn't get alone time.  H works from home. Adopted D just started back in person pre-K.  Worries lockdown hurting the kids and her marriage. Average worse up to 14-15  HA monthly last couple of months. Adoption finally done after 3 years.  H been home working since March, not good.  Gets on her nerves. Plan: option Abilify potentiation  07/10/20 appt noted: CO difficulty getting Xanax refill on time.   Overall about the same with no energy and no desire to do anything.  Stress H working from home but not really working very much.  She wants him to  go back to work.   Stress with lockdown.  Never alone now. Not depressed.   HA frequency is about the same.   Pt reports that mood is Anxious and describes anxiety as Moderate. Anxiety symptoms include: Excessive Worry,. Pt reports no sleep issues and Unless H interferes with snoring. Pt reports that appetite is good. Pt reports that energy is poor and no change. Concentration is down slightly. Suicidal thoughts:  denied by patient. Plan: No med changes  04/06/2021 phone call complaining of weight gain apparently from Lexapro.  She was given instructions to  gradually reduce from 30 mg to 20 mg and was encouraged to make an earlier appointment so that we could change perhaps to venlafaxine.  05/27/2021 appointment with the following noted: Tried lower dose Lexapro to 20 mg but too irritable and too anxious to manage.  Gained 25# since on Lexapro and didn't lose weight while on 20 mg Lexapro.   Adopted D. And has grown kids. A little panic and anxiety. Propranolol reduced HA from 15/mo to 8/mo. Plan: DT wt gain with Lexapro, Reduce Lexapro to 20 mg daily and start fluoxetine 20 mg daily for 1 week, Then increase fluoxetine to 2 of the 20 mg capsules and reduce Lexapro to 10 mg daily for 1 week. Then increase fluoxetine to 3 capsules daily and stop Lexapro  08/27/2021 appointment with the following noted: Got Covid in Oct mild. Waited until mid Sept to change and I've been fine and lost 4#.  No more anxious than before. Feels a little more giggly and more like herself.   Taking Xanax TID but may be 1/2 in AM.  No panic attacks. Adopted D has been sick a lot lately and hospitalized last week.  47 yo.  Better now.  She was traumatized from the hospital stay with all the needles.   Propanolol for HA..  had a lot of HA during D's 2 week illness. No SE problems. Not depressed.   Plan: Continue fluoxetine 60 mg daily and Xanax 1 mg 3 times daily  02/25/2022 appointment with the following noted: Hysterectomy Feb and no migraine and  limited Xanax and better mood while on opioids in the month.  Percocet for the month.  More motivated and interested and energy. H says she was like a different person that month. Almost impossible to get anything done bc no energy, not interested in anything but TV.  Not sad.  Unproductive. Anxiety unchanged from last visit and chronic.  Imitrex makes HA stop.  Frequency mainly menstrual total 6-7 monthly. Plan: DT anhedonia, low motivation, low energy trial Wellbutrin 150 to 300 mg AM  04/22/22 appt noted: Took Wellbutrin  for a month and it caused HA and anxiety and stopped it.  Still has the HA.  Hx HA but were not daily. Propranolol helped reduce severity of HA. Mood is a little better. Switched to [employer] and it helps her feel better, calmer. Subtle. Manages panic well and can talk herself down when they start coming.   Taking xanax 1 mg TID without SE.  Not sleepy from it now. Not much desire to go out.  Tends to have more HA if goes out of the house. Continues fluoxetine 60 mg daily and without SE A lot of depression is situational and she doesn't want med changes. Sleep ok with Xanax and Benadryl. Hysterectomy reduced abd pain. Terrified of topiramate.  10/25/22 appt noted: On fluoxetine 60 mg daily, Xanax 1 mg TID, Imitrex. Gets neuro  to get propranolol and asks I RX 30 mg HS, Flexeril prn HA. 10 mg .  No sedation. Cut HA in half. Stressed as usual.  Doing ok overall. No recent panic but gets anxious.   Tolerates meds well. Sleep better now than it was.     Past Psychiatric Medication Trials:  Doxazosin,   Fluoxetine partial response, sertraline colitis, lexapro 30 SE wt gain,  buspirone   Abilify 5 for couple mos insomnia, helped energy but wt gain  Percocet helped mood Dilaudid panic  Review of Systems:  Review of Systems  Constitutional:  Negative for fatigue.  Respiratory:  Negative for shortness of breath.   Cardiovascular:  Negative for chest pain and palpitations.  Neurological:  Positive for headaches. Negative for tremors and weakness.  Psychiatric/Behavioral:  Positive for depression. Negative for agitation, behavioral problems, confusion, decreased concentration, dysphoric mood, hallucinations, self-injury, sleep disturbance and suicidal ideas. The patient is nervous/anxious. The patient is not hyperactive.     Medications: I have reviewed the patient's current medications.  Current Outpatient Medications  Medication Sig Dispense Refill   ASHWAGANDHA PO Take by  mouth.     Aspirin-Acetaminophen-Caffeine (EXCEDRIN PO) Take 2 tablets by mouth every 8 (eight) hours as needed. headache      co-enzyme Q-10 30 MG capsule Take 30 mg by mouth 3 (three) times daily.     CRANBERRY CONCENTRATE PO Take by mouth. 8400 mg     cyclobenzaprine (FLEXERIL) 10 MG tablet Take 1 tablet (10 mg total) by mouth 3 (three) times daily as needed for muscle spasms. 90 tablet 0   diphenhydrAMINE (BENADRYL) 25 mg capsule Take 25 mg by mouth at bedtime as needed. sleep      Garlic 4193 MG CAPS Take by mouth.     Misc Natural Products (GLUCOSAMINE CHOND COMPLEX/MSM PO) Take by mouth.     Multiple Vitamin (MULTIVITAMIN) tablet Take 1 tablet by mouth daily.       Omega-3 Fatty Acids (FISH OIL CONCENTRATE PO) Take by mouth.     SUMAtriptan (IMITREX) 100 MG tablet May repeat in 2 hours if headache persists or recurs. 27 tablet 8   Turmeric (QC TUMERIC COMPLEX PO) Take by mouth.     UNABLE TO FIND daily. Med Name: Zinc     vitamin C (ASCORBIC ACID) 500 MG tablet Take 500 mg by mouth daily.     Vitamin D, Ergocalciferol, (DRISDOL) 1.25 MG (50000 UNIT) CAPS capsule Take 50,000 Units by mouth every 7 (seven) days. Includes in Vitamin K     ALPRAZolam (XANAX) 1 MG tablet Take 1 tablet (1 mg total) by mouth 3 (three) times daily. 270 tablet 1   FLUoxetine (PROZAC) 20 MG capsule Take 3 capsules (60 mg total) by mouth daily. 270 capsule 1   propranolol (INDERAL) 20 MG tablet 1 in the AM and 2 at night 270 tablet 1   No current facility-administered medications for this visit.    Medication Side Effects: None ?sweating  Allergies:  Allergies  Allergen Reactions   Levofloxacin     Severe headache   Sulfa Antibiotics     dizziness    Past Medical History:  Diagnosis Date   Anxiety    generalized anxiety disorder   Arthritis    fingers   Breast mass    2 lumps lt breast unknown time    COVID 07/2021   mild symptoms, sore throat, achiness, fever   Depressed    Endometriosis 2015    Family history  of breast cancer    Family history of colon cancer    GERD (gastroesophageal reflux disease)    occasional   Hypothyroidism    TSH slightly high per pt, she is taking supplements to help 11/18/21   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   Wears glasses     Family History  Problem Relation Age of Onset   Colon polyps Mother        cancerous   Colon cancer Mother 68   Breast cancer Paternal Aunt 11   Breast cancer Paternal Grandfather 79   Breast cancer Paternal Aunt 110       Genetic testing - MUTYH mutation   Melanoma Maternal Uncle        dx in his 4s   Breast cancer Other        MGMs sister dx in her 17s-30s    Social History   Socioeconomic History   Marital status: Married    Spouse name: Not on file   Number of children: 1   Years of education: Not on file   Highest education level: Not on file  Occupational History   Occupation: unemployed  Tobacco Use   Smoking status: Never   Smokeless tobacco: Never  Vaping Use   Vaping Use: Never used  Substance and Sexual Activity   Alcohol use: No   Drug use: No   Sexual activity: Yes    Birth control/protection: None  Other Topics Concern   Not on file  Social History Narrative   Not on file   Social Determinants of Health   Financial Resource Strain: Not on file  Food Insecurity: Not on file  Transportation Needs: Not on file  Physical Activity: Not on file  Stress: Not on file  Social Connections: Not on file  Intimate Partner Violence: Not on file    Past Medical History, Surgical history, Social history, and Family history were reviewed and updated as appropriate.   Please see review of systems for further details on the patient's review from today.   Objective:    Physical Exam:  There were no vitals taken for this visit.  Physical Exam Neurological:     Mental Status: She is alert and oriented to person, place, and time.     Cranial Nerves: No dysarthria.  Psychiatric:        Attention and Perception: Attention and perception normal.        Mood and Affect: Mood is anxious and depressed.        Speech: Speech normal.        Behavior: Behavior is cooperative.        Thought Content: Thought content normal. Thought content is not paranoid or delusional. Thought content does not include homicidal or suicidal ideation. Thought content does not include suicidal plan.        Cognition and Memory: Cognition and memory normal.        Judgment: Judgment normal.     Comments: Insight intact Residual anxiety and situational depression but manageable.     Lab Review:     Component Value Date/Time   NA 135 11/23/2021 1201   K 4.0 11/23/2021 1201   CL 104 11/23/2021 1201   CO2 24 11/23/2021 1201  GLUCOSE 98 11/23/2021 1201   BUN 14 11/23/2021 1201   CREATININE 0.71 11/23/2021 1201   CALCIUM 9.2 11/23/2021 1201   GFRNONAA >60 11/23/2021 1201   GFRAA >90 03/02/2014 0120       Component Value Date/Time   WBC 12.5 (H) 11/27/2021 0051   RBC 3.29 (L) 11/27/2021 0051   HGB 11.0 (L) 11/27/2021 0051   HCT 33.0 (L) 11/27/2021 0051   PLT 277 11/27/2021 0051   MCV 100.3 (H) 11/27/2021 0051   MCH 33.4 11/27/2021 0051   MCHC 33.3 11/27/2021 0051   RDW 12.8 11/27/2021 0051   LYMPHSABS 1.0 03/02/2014 0120   MONOABS 0.6 03/02/2014 0120   EOSABS 0.0 03/02/2014 0120   BASOSABS 0.0 03/02/2014 0120    No results found for: "POCLITH", "LITHIUM"   No results found for: "PHENYTOIN", "PHENOBARB", "VALPROATE", "CBMZ"   .res Assessment: Plan:    Major depressive disorder, recurrent episode, moderate (HCC)  Generalized anxiety disorder - Plan: ALPRAZolam (XANAX) 1 MG tablet, FLUoxetine (PROZAC) 20 MG capsule, propranolol (INDERAL) 20 MG  tablet  Panic disorder with agoraphobia - Plan: ALPRAZolam (XANAX) 1 MG tablet, FLUoxetine (PROZAC) 20 MG capsule  Migraine without aura and without status migrainosus, not intractable - Plan: propranolol (INDERAL) 20 MG tablet, cyclobenzaprine (FLEXERIL) 10 MG tablet   Greater than 50% of 30-minute video time with patient was spent on counseling and coordination of care.  She does still have some panic attacks sometimes are triggered by migraine headaches and sometimes they are just generally stress related but they are not full panic they are typically limited symptom panic attacks.  She does take the Xanax '1mg'$  TID   She agrees to the limit of 3 a day. She is taking 3 mg daily.  She denies SE.   She denies abusing any alprazolam.  We discussed the short-term risks associated with benzodiazepines including sedation and increased fall risk among others.  Discussed long-term side effect risk including dependence, potential withdrawal symptoms, and the potential eventual dose-related risk of dementia. Newer studies refute that.   Can't take Wellbutrin DT SE  Option switch to Effexor for help with less weight gain.  She prefers return to Prozac bc she knows it didn't make her hungry like the Lexapro.  Disc other alternatives like Trintellix but is less reliably effective for anxiety though it does have less weight gain risk.  Discussed side effects of each of these options in detail.   Disc SE in detail and SSRI withdrawal sx.  Continue fluoxetine 60 mg daily.  Disc with HA frequency she consider Aimovig, Ajovy etc. she is having more headaches but it is likely stress related.  Discussed these medications in detail.  She has seen a neurologist about her headaches [facility] put her on propranolol as preventative which has reduced headaches 50%.  She was also prescribed Flexeril 10 mg 3 times daily as needed headache which she takes rarely.  She has taken over these medications in addition to prescribing the  Imitrex because her neurologist has left the practice.  That is reasonable.  We are supplying the Imitrex as needed for headaches and they appear reasonably managed.  She is satisfied with current medications  FU 6 mo  Lynder Parents, MD, DFAPA   Please see After Visit Summary for patient specific instructions.  No future appointments.  No orders of the defined types were placed in this encounter.     -------------------------------

## 2023-01-02 DIAGNOSIS — Z87440 Personal history of urinary (tract) infections: Secondary | ICD-10-CM | POA: Diagnosis not present

## 2023-01-02 DIAGNOSIS — R8290 Unspecified abnormal findings in urine: Secondary | ICD-10-CM | POA: Diagnosis not present

## 2023-01-02 DIAGNOSIS — Z113 Encounter for screening for infections with a predominantly sexual mode of transmission: Secondary | ICD-10-CM | POA: Diagnosis not present

## 2023-01-02 DIAGNOSIS — R300 Dysuria: Secondary | ICD-10-CM | POA: Diagnosis not present

## 2023-01-04 ENCOUNTER — Other Ambulatory Visit: Payer: Self-pay | Admitting: Psychiatry

## 2023-01-04 DIAGNOSIS — G43009 Migraine without aura, not intractable, without status migrainosus: Secondary | ICD-10-CM

## 2023-01-26 DIAGNOSIS — N390 Urinary tract infection, site not specified: Secondary | ICD-10-CM | POA: Diagnosis not present

## 2023-02-15 DIAGNOSIS — L218 Other seborrheic dermatitis: Secondary | ICD-10-CM | POA: Diagnosis not present

## 2023-02-15 DIAGNOSIS — D225 Melanocytic nevi of trunk: Secondary | ICD-10-CM | POA: Diagnosis not present

## 2023-02-15 DIAGNOSIS — D2271 Melanocytic nevi of right lower limb, including hip: Secondary | ICD-10-CM | POA: Diagnosis not present

## 2023-02-15 DIAGNOSIS — D2272 Melanocytic nevi of left lower limb, including hip: Secondary | ICD-10-CM | POA: Diagnosis not present

## 2023-04-04 ENCOUNTER — Other Ambulatory Visit: Payer: Self-pay | Admitting: Obstetrics and Gynecology

## 2023-04-04 DIAGNOSIS — Z803 Family history of malignant neoplasm of breast: Secondary | ICD-10-CM

## 2023-04-25 ENCOUNTER — Other Ambulatory Visit: Payer: Self-pay | Admitting: Psychiatry

## 2023-04-25 DIAGNOSIS — F4001 Agoraphobia with panic disorder: Secondary | ICD-10-CM

## 2023-04-25 DIAGNOSIS — F411 Generalized anxiety disorder: Secondary | ICD-10-CM

## 2023-04-26 NOTE — Telephone Encounter (Signed)
Please call to schedule an appt, due now.

## 2023-05-04 NOTE — Telephone Encounter (Signed)
Pt is scheduled for 9/4.

## 2023-05-05 ENCOUNTER — Other Ambulatory Visit: Payer: Self-pay | Admitting: Psychiatry

## 2023-05-05 DIAGNOSIS — F4001 Agoraphobia with panic disorder: Secondary | ICD-10-CM

## 2023-05-05 DIAGNOSIS — F411 Generalized anxiety disorder: Secondary | ICD-10-CM

## 2023-05-22 ENCOUNTER — Other Ambulatory Visit: Payer: Self-pay | Admitting: Psychiatry

## 2023-05-22 DIAGNOSIS — G43009 Migraine without aura, not intractable, without status migrainosus: Secondary | ICD-10-CM

## 2023-05-23 ENCOUNTER — Ambulatory Visit
Admission: RE | Admit: 2023-05-23 | Discharge: 2023-05-23 | Disposition: A | Discharge status: Home / Self Care | Payer: BC Managed Care – PPO | Source: Ambulatory Visit | Attending: Obstetrics and Gynecology | Admitting: Obstetrics and Gynecology

## 2023-05-23 DIAGNOSIS — N6012 Diffuse cystic mastopathy of left breast: Secondary | ICD-10-CM | POA: Diagnosis not present

## 2023-05-23 DIAGNOSIS — Z803 Family history of malignant neoplasm of breast: Secondary | ICD-10-CM

## 2023-05-23 DIAGNOSIS — N6011 Diffuse cystic mastopathy of right breast: Secondary | ICD-10-CM | POA: Diagnosis not present

## 2023-05-23 DIAGNOSIS — Z1239 Encounter for other screening for malignant neoplasm of breast: Secondary | ICD-10-CM | POA: Diagnosis not present

## 2023-05-23 MED ORDER — GADOPICLENOL 0.5 MMOL/ML IV SOLN
7.0000 mL | Freq: Once | INTRAVENOUS | Status: AC | PRN
  Administered 2023-05-23: 7 mL via INTRAVENOUS

## 2023-06-14 ENCOUNTER — Encounter: Payer: Self-pay | Admitting: Psychiatry

## 2023-06-14 ENCOUNTER — Ambulatory Visit (INDEPENDENT_AMBULATORY_CARE_PROVIDER_SITE_OTHER): Payer: BC Managed Care – PPO | Admitting: Psychiatry

## 2023-06-14 DIAGNOSIS — Z6825 Body mass index (BMI) 25.0-25.9, adult: Secondary | ICD-10-CM | POA: Diagnosis not present

## 2023-06-14 DIAGNOSIS — Z1231 Encounter for screening mammogram for malignant neoplasm of breast: Secondary | ICD-10-CM | POA: Diagnosis not present

## 2023-06-14 DIAGNOSIS — F4001 Agoraphobia with panic disorder: Secondary | ICD-10-CM | POA: Diagnosis not present

## 2023-06-14 DIAGNOSIS — F411 Generalized anxiety disorder: Secondary | ICD-10-CM | POA: Diagnosis not present

## 2023-06-14 DIAGNOSIS — F331 Major depressive disorder, recurrent, moderate: Secondary | ICD-10-CM

## 2023-06-14 DIAGNOSIS — G43009 Migraine without aura, not intractable, without status migrainosus: Secondary | ICD-10-CM | POA: Diagnosis not present

## 2023-06-14 DIAGNOSIS — Z01419 Encounter for gynecological examination (general) (routine) without abnormal findings: Secondary | ICD-10-CM | POA: Diagnosis not present

## 2023-06-14 MED ORDER — ALPRAZOLAM 1 MG PO TABS
1.0000 mg | ORAL_TABLET | Freq: Three times a day (TID) | ORAL | 1 refills | Status: DC
Start: 2023-06-14 — End: 2023-12-27

## 2023-06-14 MED ORDER — FLUOXETINE HCL 20 MG PO CAPS
60.0000 mg | ORAL_CAPSULE | Freq: Every day | ORAL | 3 refills | Status: DC
Start: 2023-06-14 — End: 2024-03-13

## 2023-06-14 MED ORDER — PROPRANOLOL HCL 20 MG PO TABS
ORAL_TABLET | ORAL | 1 refills | Status: DC
Start: 2023-06-14 — End: 2023-10-26

## 2023-06-14 NOTE — Progress Notes (Signed)
Rachel Ryan [id number] [birth year] 47 y.o.  Video Visit via My Chart  I connected with pt by video using My Chart and verified that I am speaking with the correct person using two identifiers.   I discussed the limitations, risks, security and privacy concerns of performing an evaluation and management service by My Chart  and the availability of in person appointments. I also discussed with the patient that there may be a patient responsible charge related to this service. The patient expressed understanding and agreed to proceed.  I discussed the assessment and treatment plan with the patient. The patient was provided an opportunity to ask questions and all were answered. The patient agreed with the plan and demonstrated an understanding of the instructions.   The patient was advised to call back or seek an in-person evaluation if the symptoms worsen or if the condition fails to improve as anticipated.  I provided 30 minutes of video time during this encounter.  The patient was located at home and the provider was located office. Session from 100-130 pm  Subjective:   Patient ID:  Rachel Ryan is a 47 y.o. (DOB 1975-10-26) female.  Chief Complaint:  Chief Complaint  Patient presents with   Follow-up   Anxiety   Depression    Anxiety Symptoms include nervous/anxious behavior. Patient reports no chest pain, confusion, decreased concentration, palpitations, shortness of breath or suicidal ideas.    Depression        Associated symptoms include headaches.  Associated symptoms include no decreased concentration, no fatigue and no suicidal ideas.  Past medical history includes anxiety.    Clois Comber Schnabel presents to the office today for follow-up of anxiety and migraine.  When seen seen in October.  She was under a lot of stress and asking for more Xanax.  Since then she got a 90-day supply of Xanax and it appears she exceeded the dosage recommended taking up to 6  daily.  She then sought an early refill.  She denies this.  She denies exceeding 3 daily average.  She says that [facility] is refilling the wrong dose of 180 ct when it should be #270/90 days for 3 daily.   visit September 2020.  She was doing relatively well emotionally but was concerned about her weight.  Therefore we made the following decision: No med changes today except OK gradual reduction in Lexapro towards a target of 20 mg daily to see if weight gain will be improved.    12/22/19 appt noted: Went down to 20 mg Lexapro for a month but very irritable and angry so increased it back to 30 mg and it's better but not gone.  No energy, don't want to do anything and is irritable.  Situational depression.  Main stress H staying at home all the time.  Daycare and school out a lot so everyone all together all the time and hard on her if doesn't get alone time.  H works from home. Adopted D just started back in person pre-K.  Worries lockdown hurting the kids and her marriage. Average worse up to 14-15  HA monthly last couple of months. Adoption finally done after 3 years.  H been home working since March, not good.  Gets on her nerves. Plan: option Abilify potentiation  07/10/20 appt noted: CO difficulty getting Xanax refill on time.   Overall about the same with no energy and no desire to do anything.  Stress H working from home but not really  working very much.  She wants him to go back to work.   Stress with lockdown.  Never alone now. Not depressed.   HA frequency is about the same.   Pt reports that mood is Anxious and describes anxiety as Moderate. Anxiety symptoms include: Excessive Worry,. Pt reports no sleep issues and Unless H interferes with snoring. Pt reports that appetite is good. Pt reports that energy is poor and no change. Concentration is down slightly. Suicidal thoughts:  denied by patient. Plan: No med changes  04/06/2021 phone call complaining of weight gain apparently  from Lexapro.  She was given instructions to gradually reduce from 30 mg to 20 mg and was encouraged to make an earlier appointment so that we could change perhaps to venlafaxine.  05/27/2021 appointment with the following noted: Tried lower dose Lexapro to 20 mg but too irritable and too anxious to manage.  Gained 25# since on Lexapro and didn't lose weight while on 20 mg Lexapro.   Adopted D. And has grown kids. A little panic and anxiety. Propranolol reduced HA from 15/mo to 8/mo. Plan: DT wt gain with Lexapro, Reduce Lexapro to 20 mg daily and start fluoxetine 20 mg daily for 1 week, Then increase fluoxetine to 2 of the 20 mg capsules and reduce Lexapro to 10 mg daily for 1 week. Then increase fluoxetine to 3 capsules daily and stop Lexapro  08/27/2021 appointment with the following noted: Got Covid in Oct mild. Waited until mid Sept to change and I've been fine and lost 4#.  No more anxious than before. Feels a little more giggly and more like herself.   Taking Xanax TID but may be 1/2 in AM.  No panic attacks. Adopted D has been sick a lot lately and hospitalized last week.  47 yo.  Better now.  She was traumatized from the hospital stay with all the needles.   Propanolol for HA..  had a lot of HA during D's 2 week illness. No SE problems. Not depressed.   Plan: Continue fluoxetine 60 mg daily and Xanax 1 mg 3 times daily  02/25/2022 appointment with the following noted: Hysterectomy Feb and no migraine and  limited Xanax and better mood while on opioids in the month.  Percocet for the month.  More motivated and interested and energy. H says she was like a different person that month. Almost impossible to get anything done bc no energy, not interested in anything but TV.  Not sad.  Unproductive. Anxiety unchanged from last visit and chronic.  Imitrex makes HA stop.  Frequency mainly menstrual total 6-7 monthly. Plan: DT anhedonia, low motivation, low energy trial Wellbutrin 150 to 300  mg AM  04/22/22 appt noted: Took Wellbutrin for a month and it caused HA and anxiety and stopped it.  Still has the HA.  Hx HA but were not daily. Propranolol helped reduce severity of HA. Mood is a little better. Switched to [employer] and it helps her feel better, calmer. Subtle. Manages panic well and can talk herself down when they start coming.   Taking xanax 1 mg TID without SE.  Not sleepy from it now. Not much desire to go out.  Tends to have more HA if goes out of the house. Continues fluoxetine 60 mg daily and without SE A lot of depression is situational and she doesn't want med changes. Sleep ok with Xanax and Benadryl. Hysterectomy reduced abd pain. Terrified of topiramate.  10/25/22 appt noted: On fluoxetine 60 mg  daily, Xanax 1 mg TID, Imitrex. Gets neuro to get propranolol and asks I RX 30 mg HS, Flexeril prn HA. 10 mg .  No sedation. Cut HA in half. Stressed as usual.  Doing ok overall. No recent panic but gets anxious.   Tolerates meds well. Sleep better now than it was.   Plan no changes  06/14/23 appt noted: Meds as above.   Separated for a year and anticipates divorce soon.  Was married for 20 years.  It has been hard. Expects less stress when it's done. H is like Dr. Sofie Rower and Mr. Sheppard Plumber. Mood comes and goes with situation.  Not major depression.  No panic.  Waxe and wanes anxiety with mood but expect It to get better.  Sleep variable. D wakes her up a lot at 47 yo.   Started FT work in August [employer] where D goes.    Past Psychiatric Medication Trials:  Doxazosin,   Fluoxetine 60, sertraline colitis, lexapro 30 SE wt gain,  buspirone   Abilify 5 for couple mos insomnia, helped energy but wt gain  Percocet helped mood Dilaudid panic  Review of Systems:  Review of Systems  Constitutional:  Negative for fatigue.  Respiratory:  Negative for shortness of breath.   Cardiovascular:  Negative for chest pain and palpitations.  Neurological:  Positive  for headaches. Negative for weakness.  Psychiatric/Behavioral:  Negative for agitation, behavioral problems, confusion, decreased concentration, dysphoric mood, hallucinations, self-injury, sleep disturbance and suicidal ideas. The patient is nervous/anxious. The patient is not hyperactive.     Medications: I have reviewed the patient's current medications.  Current Outpatient Medications  Medication Sig Dispense Refill   ASHWAGANDHA PO Take by mouth.     Aspirin-Acetaminophen-Caffeine (EXCEDRIN PO) Take 2 tablets by mouth every 8 (eight) hours as needed. headache      co-enzyme Q-10 30 MG capsule Take 30 mg by mouth 3 (three) times daily.     CRANBERRY CONCENTRATE PO Take by mouth. 8400 mg     cyclobenzaprine (FLEXERIL) 10 MG tablet TAKE 1 TABLET THREE TIMES A DAY AS NEEDED FOR MUSCLE SPASMS 90 tablet 11   diphenhydrAMINE (BENADRYL) 25 mg capsule Take 25 mg by mouth at bedtime as needed. sleep      Garlic 1000 MG CAPS Take by mouth.     Misc Natural Products (GLUCOSAMINE CHOND COMPLEX/MSM PO) Take by mouth.     Multiple Vitamin (MULTIVITAMIN) tablet Take 1 tablet by mouth daily.       Omega-3 Fatty Acids (FISH OIL CONCENTRATE PO) Take by mouth.     SUMAtriptan (IMITREX) 100 MG tablet TAKE 1 TABLET AND MAY REPEAT IN 2 HOURS IF HEADACHE PERSISTS OR RECURS 27 tablet 1   Turmeric (QC TUMERIC COMPLEX PO) Take by mouth.     UNABLE TO FIND daily. Med Name: Zinc     vitamin C (ASCORBIC ACID) 500 MG tablet Take 500 mg by mouth daily.     Vitamin D, Ergocalciferol, (DRISDOL) 1.25 MG (50000 UNIT) CAPS capsule Take 50,000 Units by mouth every 7 (seven) days. Includes in Vitamin K     ALPRAZolam (XANAX) 1 MG tablet Take 1 tablet (1 mg total) by mouth 3 (three) times daily. 270 tablet 1   FLUoxetine (PROZAC) 20 MG capsule Take 3 capsules (60 mg total) by mouth daily. 270 capsule 3   propranolol (INDERAL) 20 MG tablet 1 in the AM and 2 at night 270 tablet 1   No current facility-administered medications  for this  visit.    Medication Side Effects: None ?sweating  Allergies:  Allergies  Allergen Reactions   Levofloxacin     Severe headache   Sulfa Antibiotics     dizziness    Past Medical History:  Diagnosis Date   Anxiety    generalized anxiety disorder   Arthritis    fingers   Breast mass    2 lumps lt breast unknown time    COVID 07/2021   mild symptoms, sore throat, achiness, fever   Depressed    Endometriosis 2015   Family history of breast cancer    Family history of colon cancer    GERD (gastroesophageal reflux disease)    occasional   Hypothyroidism    TSH slightly high per pt, she is taking supplements to help 11/18/21   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   Wears glasses     Family History  Problem Relation Age of Onset   Colon polyps Mother        cancerous   Colon cancer Mother 63   Breast cancer Paternal Aunt 55   Breast cancer Paternal Grandfather 32   Breast cancer Paternal Aunt 6       Genetic testing - MUTYH mutation   Melanoma Maternal Uncle        dx in his 9s   Breast cancer Other        MGMs sister dx in her 50s-30s    Social History   Socioeconomic History   Marital status: Married    Spouse name: Not on file   Number of children: 1   Years of education: Not on file   Highest education level: Not on file  Occupational History   Occupation: unemployed  Tobacco Use   Smoking status: Never   Smokeless tobacco: Never  Vaping Use   Vaping status: Never Used  Substance and Sexual Activity   Alcohol use: No   Drug use: No   Sexual activity: Yes    Birth control/protection: None  Other Topics Concern   Not on file  Social History Narrative   Not on file   Social Determinants of Health   Financial  Resource Strain: Not on file  Food Insecurity: No Food Insecurity (12/25/2020)   Received from [facility], [facility]   Hunger Vital Sign    Worried About Running Out of Food in the Last Year: Never true    Ran Out of Food in the Last Year: Never true  Transportation Needs: Not on file  Physical Activity: Not on file  Stress: Not on file  Social Connections: Unknown (02/13/2022)   Received from [facility], [facility]   Social Network    Social Network: Not on file  Intimate Partner Violence: Unknown (01/11/2022)   Received from [facility], [facility]   HITS    Physically Hurt: Not on file    Insult or Talk Down To: Not on file    Threaten Physical Harm: Not on file    Scream or Curse: Not on file    Past Medical History, Surgical history, Social history, and Family history were reviewed and updated as appropriate.   Please see review of systems for further details on the patient's review from today.  Objective:   Physical Exam:  There were no vitals taken for this visit.  Physical Exam Neurological:     Mental Status: She is alert and oriented to person, place, and time.     Cranial Nerves: No dysarthria.  Psychiatric:        Attention and Perception: Attention and perception normal.        Mood and Affect: Mood is anxious. Mood is not depressed.        Speech: Speech normal.        Behavior: Behavior is cooperative.        Thought Content: Thought content normal. Thought content is not paranoid or delusional. Thought content does not include homicidal or suicidal ideation. Thought content does not include suicidal plan.        Cognition and Memory: Cognition and memory normal.        Judgment: Judgment normal.     Comments: Insight intact Residual anxiety and situational depression but manageable.     Lab Review:     Component Value Date/Time   NA 135 11/23/2021 1201   K 4.0 11/23/2021 1201   CL 104 11/23/2021 1201   CO2 24 11/23/2021 1201    GLUCOSE 98 11/23/2021 1201   BUN 14 11/23/2021 1201   CREATININE 0.71 11/23/2021 1201   CALCIUM 9.2 11/23/2021 1201   GFRNONAA >60 11/23/2021 1201   GFRAA >90 03/02/2014 0120       Component Value Date/Time   WBC 12.5 (H) 11/27/2021 0051   RBC 3.29 (L) 11/27/2021 0051   HGB 11.0 (L) 11/27/2021 0051   HCT 33.0 (L) 11/27/2021 0051   PLT 277 11/27/2021 0051   MCV 100.3 (H) 11/27/2021 0051   MCH 33.4 11/27/2021 0051   MCHC 33.3 11/27/2021 0051   RDW 12.8 11/27/2021 0051   LYMPHSABS 1.0 03/02/2014 0120   MONOABS 0.6 03/02/2014 0120   EOSABS 0.0 03/02/2014 0120   BASOSABS 0.0 03/02/2014 0120    No results found for: "POCLITH", "LITHIUM"   No results found for: "PHENYTOIN", "PHENOBARB", "VALPROATE", "CBMZ"   .res Assessment: Plan:    Major depressive disorder, recurrent episode, moderate (HCC)  Generalized anxiety disorder - Plan: ALPRAZolam (XANAX) 1 MG tablet, FLUoxetine (PROZAC) 20 MG capsule, propranolol (INDERAL) 20 MG tablet  Panic disorder with agoraphobia - Plan: ALPRAZolam (XANAX) 1 MG tablet, FLUoxetine (PROZAC) 20 MG capsule  Migraine without aura and without status migrainosus, not intractable - Plan: propranolol (INDERAL) 20 MG tablet   30-minute face to face time with patient was spent on counseling and coordination of care.  She does still have some panic attacks sometimes are triggered by migraine headaches and sometimes they are just generally stress related but they are not full panic they are typically limited symptom panic attacks.  She does take the Xanax 1mg  TID   She agrees to the limit of 3 a day. She is taking 3 mg daily.  She denies SE.   She denies abusing any alprazolam.  We discussed the short-term risks associated with benzodiazepines including sedation and increased fall risk among others.  Discussed long-term side effect risk including dependence, potential withdrawal symptoms, and the potential eventual dose-related risk of dementia. Newer studies  refute that.   Can't take Wellbutrin DT SE  Option switch to Effexor for help with less weight gain.  She prefers return to Prozac bc she knows it didn't make her hungry like the Lexapro.  Disc other alternatives like Trintellix but is less reliably effective for  anxiety though it does have less weight gain risk.  Discussed side effects of each of these options in detail.   Disc SE in detail and SSRI withdrawal sx.  Continue fluoxetine 60 mg daily.  Disc with HA frequency she consider Aimovig, Ajovy etc. she is having more headaches but it is likely stress related.  Discussed these medications in detail.  She has seen a neurologist about her headaches Hooper put her on propranolol as preventative which has reduced headaches 50%.  She was also prescribed Flexeril 10 mg 3 times daily as needed headache which she takes rarely.  We has taken over these medications in addition to prescribing the Imitrex because her neurologist has left the practice.  That is reasonable.  We are supplying the Imitrex as needed for headaches and they appear reasonably managed.    Overall doing well with meds and no changes desired.   She is satisfied with current medications Continue Imitrex 100 mg as needed headache, propranolol 20 mg every morning 40 mg nightly for migraine prevention, fluoxetine 60 mg daily, cyclobenzaprine 10 mg 3 times daily as needed headache, alprazolam 1 mg 3 times daily  FU 6 mo  Meredith Staggers, MD, DFAPA   Please see After Visit Summary for patient specific instructions.  No future appointments.  No orders of the defined types were placed in this encounter.     -------------------------------

## 2023-06-21 DIAGNOSIS — Z13228 Encounter for screening for other metabolic disorders: Secondary | ICD-10-CM | POA: Diagnosis not present

## 2023-06-21 DIAGNOSIS — E039 Hypothyroidism, unspecified: Secondary | ICD-10-CM | POA: Diagnosis not present

## 2023-06-21 DIAGNOSIS — Z1321 Encounter for screening for nutritional disorder: Secondary | ICD-10-CM | POA: Diagnosis not present

## 2023-06-21 DIAGNOSIS — Z131 Encounter for screening for diabetes mellitus: Secondary | ICD-10-CM | POA: Diagnosis not present

## 2023-06-21 DIAGNOSIS — E785 Hyperlipidemia, unspecified: Secondary | ICD-10-CM | POA: Diagnosis not present

## 2023-08-02 ENCOUNTER — Other Ambulatory Visit: Payer: Self-pay | Admitting: Psychiatry

## 2023-08-02 DIAGNOSIS — G43009 Migraine without aura, not intractable, without status migrainosus: Secondary | ICD-10-CM

## 2023-08-12 NOTE — Telephone Encounter (Signed)
No, this is a refill request from [facility] and they are asking for 8 refills.  The one on 05/23/23 was sent from Dr. Jennelle Human for 1 refill, therfore she is due for a refill.

## 2023-10-26 ENCOUNTER — Other Ambulatory Visit: Payer: Self-pay | Admitting: Psychiatry

## 2023-10-26 DIAGNOSIS — G43009 Migraine without aura, not intractable, without status migrainosus: Secondary | ICD-10-CM

## 2023-10-26 DIAGNOSIS — F411 Generalized anxiety disorder: Secondary | ICD-10-CM

## 2023-12-27 ENCOUNTER — Other Ambulatory Visit: Payer: Self-pay | Admitting: Psychiatry

## 2023-12-27 DIAGNOSIS — F4001 Agoraphobia with panic disorder: Secondary | ICD-10-CM

## 2023-12-27 DIAGNOSIS — F411 Generalized anxiety disorder: Secondary | ICD-10-CM

## 2024-03-13 ENCOUNTER — Ambulatory Visit (INDEPENDENT_AMBULATORY_CARE_PROVIDER_SITE_OTHER): Payer: BC Managed Care – PPO | Admitting: Psychiatry

## 2024-03-13 ENCOUNTER — Encounter: Payer: Self-pay | Admitting: Psychiatry

## 2024-03-13 DIAGNOSIS — F411 Generalized anxiety disorder: Secondary | ICD-10-CM | POA: Diagnosis not present

## 2024-03-13 DIAGNOSIS — G43009 Migraine without aura, not intractable, without status migrainosus: Secondary | ICD-10-CM

## 2024-03-13 DIAGNOSIS — F331 Major depressive disorder, recurrent, moderate: Secondary | ICD-10-CM

## 2024-03-13 DIAGNOSIS — F4001 Agoraphobia with panic disorder: Secondary | ICD-10-CM | POA: Diagnosis not present

## 2024-03-13 MED ORDER — ALPRAZOLAM 1 MG PO TABS: 1.0000 mg | ORAL_TABLET | Freq: Three times a day (TID) | ORAL | 1 refills | Status: DC

## 2024-03-13 MED ORDER — PROPRANOLOL HCL 20 MG PO TABS: ORAL_TABLET | ORAL | 2 refills | Status: AC

## 2024-03-13 MED ORDER — FLUOXETINE HCL 20 MG PO CAPS: 60.0000 mg | ORAL_CAPSULE | Freq: Every day | ORAL | 3 refills | Status: AC

## 2024-03-13 MED ORDER — CYCLOBENZAPRINE HCL 10 MG PO TABS: 10.0000 mg | ORAL_TABLET | Freq: Three times a day (TID) | ORAL | 2 refills | Status: AC | PRN

## 2024-03-13 NOTE — Progress Notes (Signed)
 Rachel Ryan [id number] 1975-11-25 48 y.o.    Subjective:   Patient ID:  Rachel Ryan is a 48 y.o. (DOB February 08, 1976) female.  Chief Complaint:  Chief Complaint  Patient presents with   Follow-up   Depression   Anxiety   Headache    Winfred Redel presents to the office today for follow-up of anxiety and migraine.  When seen seen in October.  She was under a lot of stress and asking for more Xanax .  Since then she got a 90-day supply of Xanax  and it appears she exceeded the dosage recommended taking up to 6 daily.  She then sought an early refill.  She denies this.  She denies exceeding 3 daily average.  She says that [facility] is refilling the wrong dose of 180 ct when it should be #270/90 days for 3 daily.   visit September 2020.  She was doing relatively well emotionally but was concerned about her weight.  Therefore we made the following decision: No med changes today except OK gradual reduction in Lexapro  towards a target of 20 mg daily to see if weight gain will be improved.    12/22/19 appt noted: Went down to 20 mg Lexapro  for a month but very irritable and angry so increased it back to 30 mg and it's better but not gone.  No energy, don't want to do anything and is irritable.  Situational depression.  Main stress H staying at home all the time.  Daycare and school out a lot so everyone all together all the time and hard on her if doesn't get alone time.  H works from home. Adopted D just started back in person pre-K.  Worries lockdown hurting the kids and her marriage. Average worse up to 14-15  HA monthly last couple of months. Adoption finally done after 3 years.  H been home working since March, not good.  Gets on her nerves. Plan: option Abilify  potentiation  07/10/20 appt noted: CO difficulty getting Xanax  refill on time.   Overall about the same with no energy and no desire to do anything.  Stress H working from home but not really working very  much.  She wants him to go back to work.   Stress with lockdown.  Never alone now. Not depressed.   HA frequency is about the same.   Pt reports that mood is Anxious and describes anxiety as Moderate. Anxiety symptoms include: Excessive Worry,. Pt reports no sleep issues and Unless H interferes with snoring. Pt reports that appetite is good. Pt reports that energy is poor and no change. Concentration is down slightly. Suicidal thoughts:  denied by patient. Plan: No med changes  04/06/2021 phone call complaining of weight gain apparently from Lexapro .  She was given instructions to gradually reduce from 30 mg to 20 mg and was encouraged to make an earlier appointment so that we could change perhaps to venlafaxine.  05/27/2021 appointment with the following noted: Tried lower dose Lexapro  to 20 mg but too irritable and too anxious to manage.  Gained 25# since on Lexapro  and didn't lose weight while on 20 mg Lexapro .   Adopted D. And has grown kids. A little panic and anxiety. Propranolol  reduced HA from 15/mo to 8/mo. Plan: DT wt gain with Lexapro , Reduce Lexapro  to 20 mg daily and start fluoxetine  20 mg daily for 1 week, Then increase fluoxetine  to 2 of the 20 mg capsules and reduce Lexapro  to 10 mg daily for 1 week.  Then increase fluoxetine  to 3 capsules daily and stop Lexapro   08/27/2021 appointment with the following noted: Got Covid in Oct mild. Waited until mid Sept to change and I've been fine and lost 4#.  No more anxious than before. Feels a little more giggly and more like herself.   Taking Xanax  TID but may be 1/2 in AM.  No panic attacks. Adopted D has been sick a lot lately and hospitalized last week.  48 yo.  Better now.  She was traumatized from the hospital stay with all the needles.   Propanolol for HA..  had a lot of HA during D's 2 week illness. No SE problems. Not depressed.   Plan: Continue fluoxetine  60 mg daily and Xanax  1 mg 3 times daily  02/25/2022 appointment with the  following noted: Hysterectomy Feb and no migraine and  limited Xanax  and better mood while on opioids in the month.  Percocet for the month.  More motivated and interested and energy. H says she was like a different person that month. Almost impossible to get anything done bc no energy, not interested in anything but TV.  Not sad.  Unproductive. Anxiety unchanged from last visit and chronic.  Imitrex  makes HA stop.  Frequency mainly menstrual total 6-7 monthly. Plan: DT anhedonia, low motivation, low energy trial Wellbutrin  150 to 300 mg AM  04/22/22 appt noted: Took Wellbutrin  for a month and it caused HA and anxiety and stopped it.  Still has the HA.  Hx HA but were not daily. Propranolol  helped reduce severity of HA. Mood is a little better. Switched to [employer] and it helps her feel better, calmer. Subtle. Manages panic well and can talk herself down when they start coming.   Taking xanax  1 mg TID without SE.  Not sleepy from it now. Not much desire to go out.  Tends to have more HA if goes out of the house. Continues fluoxetine  60 mg daily and without SE A lot of depression is situational and she doesn't want med changes. Sleep ok with Xanax  and Benadryl . Hysterectomy reduced abd pain. Terrified of topiramate.  10/25/22 appt noted: On fluoxetine  60 mg daily, Xanax  1 mg TID, Imitrex . Gets neuro to get propranolol  and asks I RX 30 mg HS, Flexeril  prn HA. 10 mg .  No sedation. Cut HA in half. Stressed as usual.  Doing ok overall. No recent panic but gets anxious.   Tolerates meds well. Sleep better now than it was.   Plan no changes  06/14/23 appt noted: Meds as above.   Separated for a year and anticipates divorce soon.  Was married for 20 years.  It has been hard. Expects less stress when it's done. H is like Dr. Alvan August and Mr. Vivia Grizzle. Mood comes and goes with situation.  Not major depression.  No panic.  Waxe and wanes anxiety with mood but expect It to get better.  Sleep  variable. D wakes her up a lot at 48 yo.   Started FT work in August [employer] where D goes.    03/13/24 appt noted:  Med: fluoxetine  60 mg daily, Xanax  1 mg TID (lunch, evening , HS) , Imitrex . Gets neuro to get propranolol  and asks I RX 30 mg HS, Flexeril  prn HA. 10 mg . Flexeril  helps reduce HA frequency and severity. No new meds. HA less frequent.  Not seeing neuro who left area.   Divorce Oct 2024 after married 20 year.s.  still adjusting.  Functioning fine.  Weird not being married.   D doing well.  48 yo doesn't understand divorce.   ExH sees her one day weekly. Anxiety about the same.  Mainly sad about it.  Feels like somebody died.  No occasion to see stepkids anymore.  No SE.   No sleepiness.  Don't have NM anymore.  No NM of being trapped anymore after divorce. Can work through panic and manage them. No concerns with meds.   Wonders about meds and mouth.  Past Psychiatric Medication Trials:  Doxazosin,   Fluoxetine  60, sertraline colitis, lexapro  30 SE wt gain,  buspirone   Abilify  5 for couple mos insomnia, helped energy but wt gain  Percocet helped mood Dilaudid  panic  Review of Systems:  Review of Systems  Constitutional:  Negative for fatigue.  Respiratory:  Negative for shortness of breath.   Cardiovascular:  Negative for chest pain and palpitations.  Neurological:  Positive for headaches. Negative for weakness.  Psychiatric/Behavioral:  Negative for agitation, behavioral problems, confusion, decreased concentration, dysphoric mood, hallucinations, self-injury, sleep disturbance and suicidal ideas. The patient is nervous/anxious. The patient is not hyperactive.     Medications: I have reviewed the patient's current medications.  Current Outpatient Medications  Medication Sig Dispense Refill   ASHWAGANDHA PO Take by mouth.     Aspirin-Acetaminophen -Caffeine (EXCEDRIN PO) Take 2 tablets by mouth every 8 (eight) hours as needed. headache      co-enzyme Q-10  30 MG capsule Take 30 mg by mouth 3 (three) times daily.     CRANBERRY CONCENTRATE PO Take by mouth. 8400 mg     diphenhydrAMINE  (BENADRYL ) 25 mg capsule Take 25 mg by mouth at bedtime as needed. sleep      Garlic 1000 MG CAPS Take by mouth.     Misc Natural Products (GLUCOSAMINE CHOND COMPLEX/MSM PO) Take by mouth.     Multiple Vitamin (MULTIVITAMIN) tablet Take 1 tablet by mouth daily.       Omega-3 Fatty Acids (FISH OIL CONCENTRATE PO) Take by mouth.     SUMAtriptan  (IMITREX ) 100 MG tablet TAKE 1 TABLET AND MAY REPEAT IN 2 HOURS IF HEADACHE PERSISTS OR RECURS 27 tablet 5   Turmeric (QC TUMERIC COMPLEX PO) Take by mouth.     UNABLE TO FIND daily. Med Name: Zinc     vitamin C (ASCORBIC ACID) 500 MG tablet Take 500 mg by mouth daily.     Vitamin D, Ergocalciferol, (DRISDOL) 1.25 MG (50000 UNIT) CAPS capsule Take 50,000 Units by mouth every 7 (seven) days. Includes in Vitamin K     ALPRAZolam  (XANAX ) 1 MG tablet Take 1 tablet (1 mg total) by mouth 3 (three) times daily. 270 tablet 1   cyclobenzaprine  (FLEXERIL ) 10 MG tablet Take 1 tablet (10 mg total) by mouth 3 (three) times daily as needed for muscle spasms. 270 tablet 2   FLUoxetine  (PROZAC ) 20 MG capsule Take 3 capsules (60 mg total) by mouth daily. 270 capsule 3   propranolol  (INDERAL ) 20 MG tablet TAKE 1 TABLET IN THE MORNING AND 2 TABLETS AT NIGHT 270 tablet 2   No current facility-administered medications for this visit.    Medication Side Effects: None ?sweating  Allergies:  Allergies  Allergen Reactions   Levofloxacin     Severe headache   Sulfa Antibiotics     dizziness    Past Medical History:  Diagnosis Date   Anxiety    generalized anxiety disorder   Arthritis    fingers   Breast mass  2 lumps lt breast unknown time    COVID 07/2021   mild symptoms, sore throat, achiness, fever   Depressed    Endometriosis 2015   Family history of breast cancer    Family history of colon cancer    GERD (gastroesophageal  reflux disease)    occasional   Hypothyroidism    TSH slightly high per pt, she is taking supplements to help 11/18/21   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   Wears glasses     Family History  Problem Relation Age of Onset   Colon polyps Mother        cancerous   Colon cancer Mother 60   Breast cancer Paternal Aunt 64   Breast cancer Paternal Grandfather 16   Breast cancer Paternal Aunt 42       Genetic testing - MUTYH mutation   Melanoma Maternal Uncle        dx in his 21s   Breast cancer Other        MGMs sister dx in her 77s-30s    Social History   Socioeconomic History   Marital status: Married    Spouse name: Not on file   Number of children: 1   Years of education: Not on file   Highest education level: Not on file  Occupational History   Occupation: unemployed  Tobacco Use   Smoking status: Never   Smokeless tobacco: Never  Vaping Use   Vaping status: Never Used  Substance and Sexual Activity   Alcohol use: No   Drug use: No   Sexual activity: Yes    Birth control/protection: None  Other Topics Concern   Not on file  Social History Narrative   Not on file   Social Drivers of Health   Financial Resource Strain: Not on file  Food Insecurity: No Food Insecurity (12/25/2020)   Received [facility], [facility]   Hunger Vital Sign    Worried About Running Out of Food in the Last Year: Never true    Ran Out of Food in the Last Year: Never true  Transportation Needs: Not on file  Physical Activity: Not on file  Stress: Not on file  Social Connections: Unknown (02/13/2022)   Received from [facility], [facility]   Social Network    Social Network: Not on file  Intimate Partner Violence: Unknown (01/11/2022)   Received  from [facility], [facility]   HITS    Physically Hurt: Not on file    Insult or Talk Down To: Not on file    Threaten Physical Harm: Not on file    Scream or Curse: Not on file    Past Medical History, Surgical history, Social history, and Family history were reviewed and updated as appropriate.   Please see review of systems for further details on the patient's review from today.   Objective:   Physical Exam:  There were no vitals taken for this visit.  Physical Exam Constitutional:      General: She is not in acute distress.    Appearance: She is well-developed.  Musculoskeletal:        General: No deformity.  Neurological:     Mental Status: She is alert and oriented to person, place,  and time.     Cranial Nerves: No dysarthria.     Coordination: Coordination normal.  Psychiatric:        Attention and Perception: Attention and perception normal.        Mood and Affect: Mood is anxious. Mood is not depressed. Affect is not labile, blunt, angry or inappropriate.        Speech: Speech normal.        Behavior: Behavior normal. Behavior is cooperative.        Thought Content: Thought content normal. Thought content is not paranoid or delusional. Thought content does not include homicidal or suicidal ideation. Thought content does not include homicidal or suicidal plan.        Cognition and Memory: Cognition and memory normal.        Judgment: Judgment normal.     Comments: Insight intact Residual anxiety and situational depression but manageable.     Lab Review:     Component Value Date/Time   NA 135 11/23/2021 1201   K 4.0 11/23/2021 1201   CL 104 11/23/2021 1201   CO2 24 11/23/2021 1201   GLUCOSE 98 11/23/2021 1201   BUN 14 11/23/2021 1201   CREATININE 0.71 11/23/2021 1201   CALCIUM 9.2 11/23/2021 1201   GFRNONAA >60 11/23/2021 1201   GFRAA >90 03/02/2014 0120       Component Value Date/Time   WBC 12.5 (H) 11/27/2021 0051   RBC 3.29 (L) 11/27/2021 0051    HGB 11.0 (L) 11/27/2021 0051   HCT 33.0 (L) 11/27/2021 0051   PLT 277 11/27/2021 0051   MCV 100.3 (H) 11/27/2021 0051   MCH 33.4 11/27/2021 0051   MCHC 33.3 11/27/2021 0051   RDW 12.8 11/27/2021 0051   LYMPHSABS 1.0 03/02/2014 0120   MONOABS 0.6 03/02/2014 0120   EOSABS 0.0 03/02/2014 0120   BASOSABS 0.0 03/02/2014 0120    No results found for: "POCLITH", "LITHIUM"   No results found for: "PHENYTOIN", "PHENOBARB", "VALPROATE", "CBMZ"   .res Assessment: Plan:    Major depressive disorder, recurrent episode, moderate (HCC)  Generalized anxiety disorder - Plan: FLUoxetine  (PROZAC ) 20 MG capsule, ALPRAZolam  (XANAX ) 1 MG tablet, propranolol  (INDERAL ) 20 MG tablet  Panic disorder with agoraphobia - Plan: FLUoxetine  (PROZAC ) 20 MG capsule, ALPRAZolam  (XANAX ) 1 MG tablet  Migraine without aura and without status migrainosus, not intractable - Plan: cyclobenzaprine  (FLEXERIL ) 10 MG tablet, propranolol  (INDERAL ) 20 MG tablet   30-minute face to face time with patient was spent on counseling and coordination of care.  She does still have some panic attacks sometimes are triggered by migraine headaches and sometimes they are just generally stress related but they are not full panic they are typically limited symptom panic attacks.  She does take the Xanax  1mg  TID   She agrees to the limit of 3 a day. She is taking 3 mg daily.  She denies SE.   She denies abusing any alprazolam .  We discussed the short-term risks associated with benzodiazepines including sedation and increased fall risk among others.  Discussed long-term side effect risk including dependence, potential withdrawal symptoms, and the potential eventual dose-related risk of dementia. Newer studies refute that.   Can't take Wellbutrin  DT SE  Option switch to Effexor for help with less weight gain.  She prefers return to Prozac  bc she knows it didn't make her hungry like the Lexapro .  Disc other alternatives like Trintellix but is  less reliably effective for anxiety though it does have less weight gain  risk.  Discussed side effects of each of these options in detail.   Disc SE in detail and SSRI withdrawal sx.  Continue fluoxetine  60 mg daily.  Disc with HA frequency she consider Aimovig, Ajovy etc. she is having more headaches but it is likely stress related.  Discussed these medications in detail.  She has seen a neurologist about her headaches Hooper put her on propranolol  as preventative which has reduced headaches 50%.  She was also prescribed Flexeril  10 mg 3 times daily as needed headache which she takes rarely.  We has taken over these medications in addition to prescribing the Imitrex  because her neurologist has left the practice.  That is reasonable.  We are supplying the Imitrex  as needed for headaches and they appear reasonably managed.    Overall doing well with meds and no changes desired.   She is satisfied with current medications Continue Imitrex  100 mg as needed headache, propranolol  20 mg every morning 40 mg nightly for migraine prevention, fluoxetine  60 mg daily, cyclobenzaprine  10 mg 3 times daily as needed headache, alprazolam  1 mg 3 times daily  FU 6 mo  Nori Beat, MD, DFAPA   Please see After Visit Summary for patient specific instructions.  No future appointments.  No orders of the defined types were placed in this encounter.     -------------------------------

## 2024-07-05 ENCOUNTER — Other Ambulatory Visit: Payer: Self-pay | Admitting: Obstetrics and Gynecology

## 2024-07-05 DIAGNOSIS — Z8249 Family history of ischemic heart disease and other diseases of the circulatory system: Secondary | ICD-10-CM

## 2024-07-05 DIAGNOSIS — Z803 Family history of malignant neoplasm of breast: Secondary | ICD-10-CM

## 2024-07-06 ENCOUNTER — Other Ambulatory Visit: Payer: Self-pay | Admitting: Obstetrics and Gynecology

## 2024-07-06 DIAGNOSIS — Z0000 Encounter for general adult medical examination without abnormal findings: Secondary | ICD-10-CM

## 2024-07-12 ENCOUNTER — Ambulatory Visit
Admission: RE | Admit: 2024-07-12 | Discharge: 2024-07-12 | Disposition: A | Discharge status: Home / Self Care | Source: Ambulatory Visit | Attending: Obstetrics and Gynecology | Admitting: Obstetrics and Gynecology

## 2024-07-12 DIAGNOSIS — Z0000 Encounter for general adult medical examination without abnormal findings: Secondary | ICD-10-CM

## 2024-07-17 ENCOUNTER — Other Ambulatory Visit: Payer: Self-pay | Admitting: Obstetrics and Gynecology

## 2024-07-17 DIAGNOSIS — R2232 Localized swelling, mass and lump, left upper limb: Secondary | ICD-10-CM

## 2024-07-18 ENCOUNTER — Encounter: Payer: Self-pay | Admitting: Obstetrics and Gynecology

## 2024-08-01 ENCOUNTER — Ambulatory Visit
Admission: RE | Admit: 2024-08-01 | Discharge: 2024-08-01 | Disposition: A | Source: Ambulatory Visit | Attending: Obstetrics and Gynecology | Admitting: Obstetrics and Gynecology

## 2024-08-01 DIAGNOSIS — R2232 Localized swelling, mass and lump, left upper limb: Secondary | ICD-10-CM

## 2024-08-02 ENCOUNTER — Other Ambulatory Visit: Payer: Self-pay | Admitting: Obstetrics and Gynecology

## 2024-08-02 ENCOUNTER — Ambulatory Visit
Admission: RE | Admit: 2024-08-02 | Discharge: 2024-08-02 | Disposition: A | Discharge status: Home / Self Care | Source: Ambulatory Visit | Attending: Obstetrics and Gynecology | Admitting: Obstetrics and Gynecology

## 2024-08-02 ENCOUNTER — Inpatient Hospital Stay
Admission: RE | Admit: 2024-08-02 | Discharge: 2024-08-02 | Attending: Obstetrics and Gynecology | Admitting: Obstetrics and Gynecology

## 2024-08-02 DIAGNOSIS — Z8249 Family history of ischemic heart disease and other diseases of the circulatory system: Secondary | ICD-10-CM

## 2024-08-02 DIAGNOSIS — Z803 Family history of malignant neoplasm of breast: Secondary | ICD-10-CM

## 2024-08-02 DIAGNOSIS — R2232 Localized swelling, mass and lump, left upper limb: Secondary | ICD-10-CM

## 2024-08-02 MED ORDER — GADOPICLENOL 0.5 MMOL/ML IV SOLN
7.0000 mL | Freq: Once | INTRAVENOUS | Status: AC | PRN
Start: 2024-08-02 — End: 2024-08-02
  Administered 2024-08-02: 7 mL via INTRAVENOUS

## 2024-09-14 ENCOUNTER — Other Ambulatory Visit: Payer: Self-pay

## 2024-09-14 ENCOUNTER — Telehealth: Payer: Self-pay | Admitting: Psychiatry

## 2024-09-14 DIAGNOSIS — F411 Generalized anxiety disorder: Secondary | ICD-10-CM

## 2024-09-14 DIAGNOSIS — F4001 Agoraphobia with panic disorder: Secondary | ICD-10-CM

## 2024-09-14 MED ORDER — ALPRAZOLAM 1 MG PO TABS: 1.0000 mg | ORAL_TABLET | Freq: Three times a day (TID) | ORAL | 1 refills | Status: AC

## 2024-09-14 NOTE — Telephone Encounter (Signed)
 Pended

## 2024-09-14 NOTE — Telephone Encounter (Signed)
 Pt called requesting New Rx Alprazolam  1 mg  [facility] [address] [place]. Apt 6/4

## 2024-09-16 ENCOUNTER — Other Ambulatory Visit: Payer: Self-pay | Admitting: Psychiatry

## 2024-09-16 DIAGNOSIS — G43009 Migraine without aura, not intractable, without status migrainosus: Secondary | ICD-10-CM

## 2024-09-25 ENCOUNTER — Encounter: Payer: Self-pay | Admitting: Internal Medicine

## 2024-09-28 ENCOUNTER — Telehealth: Payer: Self-pay | Admitting: *Deleted

## 2024-09-28 NOTE — Telephone Encounter (Signed)
 I can move her to [facility] on 1/22 or 2/12.

## 2024-09-28 NOTE — Telephone Encounter (Signed)
 Team,   This pt is a documented difficult intubation and his procedure will need to be done at the hospital.   Regards,  Norleen EMERSON Schillings

## 2024-09-28 NOTE — Telephone Encounter (Signed)
 Please see John's note.   Can patient be a direct admit to hospital or OV? Please advise.

## 2024-10-01 ENCOUNTER — Other Ambulatory Visit: Payer: Self-pay

## 2024-10-01 DIAGNOSIS — Z1211 Encounter for screening for malignant neoplasm of colon: Secondary | ICD-10-CM

## 2024-10-01 DIAGNOSIS — Z800 Family history of malignant neoplasm of digestive organs: Secondary | ICD-10-CM

## 2024-10-01 MED ORDER — NA SULFATE-K SULFATE-MG SULF 17.5-3.13-1.6 GM/177ML PO SOLN: ORAL | 0 refills | Status: AC

## 2024-10-01 NOTE — Telephone Encounter (Signed)
 Rachel Ryan,  This patient PV is tomorrow and I still see that she is scheduled for [facility] and has not been put on the hospital schedule. I am going to cancel her procedure. If we are unable to have her scheduled today her PV will need to be cancelled. Please make the patient aware of schedule changes. Thank you

## 2024-10-01 NOTE — Telephone Encounter (Signed)
 Patient contacted and rescheduled at the [facility] for 11/22/24. Patient declines pre-visit. Written instructions mailed.

## 2024-10-02 ENCOUNTER — Encounter

## 2024-10-30 ENCOUNTER — Encounter: Admitting: Internal Medicine

## 2024-11-14 ENCOUNTER — Telehealth: Payer: Self-pay | Admitting: Gastroenterology

## 2024-11-14 NOTE — Telephone Encounter (Addendum)
 Procedure:Colonoscopy Procedure date: 11/22/24 Procedure location: [facility] Arrival Time: 8:30 am Spoke with the patient Y/N:   No, I left a detailed message on [phone] on 11/14/24 @ 2:38 pm for the patient to return call   Any prep concerns? ___  Has the patient obtained the prep from the pharmacy ? ___ Do you have a care partner and transportation: ___ Any additional concerns? ___

## 2024-11-22 ENCOUNTER — Encounter (HOSPITAL_COMMUNITY): Admission: RE | Payer: Self-pay | Source: Home / Self Care

## 2024-11-22 ENCOUNTER — Ambulatory Visit (HOSPITAL_COMMUNITY): Admission: RE | Admit: 2024-11-22 | Source: Home / Self Care | Admitting: Internal Medicine

## 2024-11-22 SURGERY — COLONOSCOPY
Anesthesia: Monitor Anesthesia Care

## 2025-03-14 ENCOUNTER — Ambulatory Visit (INDEPENDENT_AMBULATORY_CARE_PROVIDER_SITE_OTHER): Admitting: Psychiatry
